# Patient Record
Sex: Female | Born: 1990 | Race: Black or African American | Hispanic: No | Marital: Single | Smoking: Never smoker
Health system: Southern US, Community
[De-identification: ages and names within clinical notes are randomized; demographics above are authoritative.]

## PROBLEM LIST (undated history)

## (undated) ENCOUNTER — Inpatient Hospital Stay (HOSPITAL_COMMUNITY): Payer: Self-pay

## (undated) DIAGNOSIS — E669 Obesity, unspecified: Secondary | ICD-10-CM

## (undated) DIAGNOSIS — L309 Dermatitis, unspecified: Secondary | ICD-10-CM

## (undated) DIAGNOSIS — O039 Complete or unspecified spontaneous abortion without complication: Secondary | ICD-10-CM

## (undated) DIAGNOSIS — A6000 Herpesviral infection of urogenital system, unspecified: Secondary | ICD-10-CM

## (undated) DIAGNOSIS — O24419 Gestational diabetes mellitus in pregnancy, unspecified control: Secondary | ICD-10-CM

## (undated) DIAGNOSIS — J45909 Unspecified asthma, uncomplicated: Secondary | ICD-10-CM

## (undated) HISTORY — DX: Dermatitis, unspecified: L30.9

## (undated) HISTORY — PX: TYMPANOSTOMY TUBE PLACEMENT: SHX32

## (undated) HISTORY — PX: NO PAST SURGERIES: SHX2092

---

## 2013-07-31 ENCOUNTER — Encounter (HOSPITAL_COMMUNITY): Payer: Self-pay | Admitting: Emergency Medicine

## 2013-07-31 ENCOUNTER — Emergency Department (HOSPITAL_COMMUNITY)
Admission: EM | Admit: 2013-07-31 | Discharge: 2013-07-31 | Disposition: A | Discharge status: Home / Self Care | Payer: BC Managed Care – PPO | Attending: Emergency Medicine | Admitting: Emergency Medicine

## 2013-07-31 DIAGNOSIS — R42 Dizziness and giddiness: Secondary | ICD-10-CM | POA: Insufficient documentation

## 2013-07-31 DIAGNOSIS — Z3490 Encounter for supervision of normal pregnancy, unspecified, unspecified trimester: Secondary | ICD-10-CM

## 2013-07-31 DIAGNOSIS — O9989 Other specified diseases and conditions complicating pregnancy, childbirth and the puerperium: Secondary | ICD-10-CM | POA: Insufficient documentation

## 2013-07-31 DIAGNOSIS — M549 Dorsalgia, unspecified: Secondary | ICD-10-CM | POA: Insufficient documentation

## 2013-07-31 DIAGNOSIS — R110 Nausea: Secondary | ICD-10-CM | POA: Insufficient documentation

## 2013-07-31 DIAGNOSIS — Z792 Long term (current) use of antibiotics: Secondary | ICD-10-CM | POA: Insufficient documentation

## 2013-07-31 DIAGNOSIS — R109 Unspecified abdominal pain: Secondary | ICD-10-CM | POA: Insufficient documentation

## 2013-07-31 LAB — URINALYSIS, ROUTINE W REFLEX MICROSCOPIC
Bilirubin Urine: NEGATIVE
GLUCOSE, UA: NEGATIVE mg/dL
Hgb urine dipstick: NEGATIVE
KETONES UR: NEGATIVE mg/dL
Leukocytes, UA: NEGATIVE
Nitrite: NEGATIVE
Protein, ur: NEGATIVE mg/dL
Specific Gravity, Urine: 1.028 (ref 1.005–1.030)
Urobilinogen, UA: 0.2 mg/dL (ref 0.0–1.0)
pH: 6 (ref 5.0–8.0)

## 2013-07-31 LAB — PREGNANCY, URINE: Preg Test, Ur: POSITIVE — AB

## 2013-07-31 NOTE — Discharge Instructions (Signed)
Back Pain in Pregnancy °Back pain during pregnancy is common. It happens in about half of all pregnancies. It is important for you and your baby that you remain active during your pregnancy. If you feel that back pain is not allowing you to remain active or sleep well, it is time to see your caregiver. Back pain may be caused by several factors related to changes during your pregnancy. Fortunately, unless you had trouble with your back before your pregnancy, the pain is likely to get better after you deliver. °Low back pain usually occurs between the fifth and seventh months of pregnancy. It can, however, happen in the first couple months. Factors that increase the risk of back problems include:  °· Previous back problems. °· Injury to your back. °· Having twins or multiple births. °· A chronic cough. °· Stress. °· Job-related repetitive motions. °· Muscle or spinal disease in the back. °· Family history of back problems, ruptured (herniated) discs, or osteoporosis. °· Depression, anxiety, and panic attacks. °CAUSES  °· When you are pregnant, your body produces a hormone called relaxin. This hormone makes the ligaments connecting the low back and pubic bones more flexible. This flexibility allows the baby to be delivered more easily. When your ligaments are loose, your muscles need to work harder to support your back. Soreness in your back can come from tired muscles. Soreness can also come from back tissues that are irritated since they are receiving less support. °· As the baby grows, it puts pressure on the nerves and blood vessels in your pelvis. This can cause back pain. °· As the baby grows and gets heavier during pregnancy, the uterus pushes the stomach muscles forward and changes your center of gravity. This makes your back muscles work harder to maintain good posture. °SYMPTOMS  °Lumbar pain during pregnancy °Lumbar pain during pregnancy usually occurs at or above the waist in the center of the back. There  may be pain and numbness that radiates into your leg or foot. This is similar to low back pain experienced by non-pregnant women. It usually increases with sitting for long periods of time, standing, or repetitive lifting. Tenderness may also be present in the muscles along your upper back. °Posterior pelvic pain during pregnancy °Pain in the back of the pelvis is more common than lumbar pain in pregnancy. It is a deep pain felt in your side at the waistline, or across the tailbone (sacrum), or in both places. You may have pain on one or both sides. This pain can also go into the buttocks and backs of the upper thighs. Pubic and groin pain may also be present. The pain does not quickly resolve with rest, and morning stiffness may also be present. °Pelvic pain during pregnancy can be brought on by most activities. A high level of fitness before and during pregnancy may or may not prevent this problem. Labor pain is usually 1 to 2 minutes apart, lasts for about 1 minute, and involves a bearing down feeling or pressure in your pelvis. However, if you are at term with the pregnancy, constant low back pain can be the beginning of early labor, and you should be aware of this. °DIAGNOSIS  °X-rays of the back should not be done during the first 12 to 14 weeks of the pregnancy and only when absolutely necessary during the rest of the pregnancy. MRIs do not give off radiation and are safe during pregnancy. MRIs also should only be done when absolutely necessary. °HOME CARE INSTRUCTIONS °· Exercise   as directed by your caregiver. Exercise is the most effective way to prevent or manage back pain. If you have a back problem, it is especially important to avoid sports that require sudden body movements. Swimming and walking are great activities. °· Do not stand in one place for long periods of time. °· Do not wear high heels. °· Sit in chairs with good posture. Use a pillow on your lower back if necessary. Make sure your head  rests over your shoulders and is not hanging forward. °· Try sleeping on your side, preferably the left side, with a pillow or two between your legs. If you are sore after a night's rest, your bed may be too soft. Try placing a board between your mattress and box spring. °· Listen to your body when lifting. If you are experiencing pain, ask for help or try bending your knees more so you can use your leg muscles rather than your back muscles. Squat down when picking up something from the floor. Do not bend over. °· Eat a healthy diet. Try to gain weight within your caregiver's recommendations. °· Use heat or cold packs 3 to 4 times a day for 15 minutes to help with the pain. °· Only take over-the-counter or prescription medicines for pain, discomfort, or fever as directed by your caregiver. °Sudden (acute) back pain °· Use bed rest for only the most extreme, acute episodes of back pain. Prolonged bed rest over 48 hours will aggravate your condition. °· Ice is very effective for acute conditions. °· Put ice in a plastic bag. °· Place a towel between your skin and the bag. °· Leave the ice on for 10 to 20 minutes every 2 hours, or as needed. °· Using heat packs for 30 minutes prior to activities is also helpful. °Continued back pain °See your caregiver if you have continued problems. Your caregiver can help or refer you for appropriate physical therapy. With conditioning, most back problems can be avoided. Sometimes, a more serious issue may be the cause of back pain. You should be seen right away if new problems seem to be developing. Your caregiver may recommend: °· A maternity girdle. °· An elastic sling. °· A back brace. °· A massage therapist or acupuncture. °SEEK MEDICAL CARE IF:  °· You are not able to do most of your daily activities, even when taking the pain medicine you were given. °· You need a referral to a physical therapist or chiropractor. °· You want to try acupuncture. °SEEK IMMEDIATE MEDICAL CARE  IF: °· You develop numbness, tingling, weakness, or problems with the use of your arms or legs. °· You develop severe back pain that is no longer relieved with medicines. °· You have a sudden change in bowel or bladder control. °· You have increasing pain in other areas of the body. °· You develop shortness of breath, dizziness, or fainting. °· You develop nausea, vomiting, or sweating. °· You have back pain which is similar to labor pains. °· You have back pain along with your water breaking or vaginal bleeding. °· You have back pain or numbness that travels down your leg. °· Your back pain developed after you fell. °· You develop pain on one side of your back. You may have a kidney stone. °· You see blood in your urine. You may have a bladder infection or kidney stone. °· You have back pain with blisters. You may have shingles. °Back pain is fairly common during pregnancy but should not be accepted as just part of   the process. Back pain should always be treated as soon as possible. This will make your pregnancy as pleasant as possible. Document Released: 09/18/2005 Document Revised: 09/02/2011 Document Reviewed: 10/30/2010 [facility] Patient Information 2014 [employer] [place]. Pregnancy If you are planning on getting pregnant, it is a good idea to make a preconception appointment with your caregiver to discuss having a healthy lifestyle before getting pregnant. This includes diet, weight, exercise, taking prenatal vitamins (especially folic acid, which helps prevent brain and spinal cord defects), avoiding alcohol, smoking and illegal drugs, medical problems (diabetes, convulsions), family history of genetic problems, working conditions, and immunizations. It is better to have knowledge of these things and do something about them before getting pregnant. During your pregnancy, it is important to follow certain guidelines in order to have a healthy baby. It is very important to get good prenatal care and follow  your caregiver's instructions. Prenatal care includes all the medical care you receive before your baby's birth. This helps to prevent problems during the pregnancy and childbirth. HOME CARE INSTRUCTIONS  Start your prenatal visits by the 12th week of pregnancy or earlier, if possible. At first, appointments are usually scheduled monthly. They become more frequent in the last 2 months before delivery. It is important that you keep your caregiver's appointments and follow your caregiver's instructions regarding medication use, exercise, and diet. During pregnancy, you are providing food for you and your baby. Eat a regular, well-balanced diet. Choose foods such as meat, fish, milk and other dairy products, vegetables, fruits, whole-grain breads and cereals. Your caregiver will inform you of the ideal weight gain depending on your current height and weight. Drink lots of liquids. Try to drink 8 glasses of water a day. Alcohol is associated with a number of birth defects including fetal alcohol syndrome. It is best to avoid alcohol completely. Smoking will cause low birth rate and prematurity. Use of alcohol and nicotine during your pregnancy also increases the chances that your child will be chemically dependent later in their life and may contribute to SIDS (Sudden Infant Death Syndrome). Do not use illegal drugs. Only take prescription or over-the-counter medications that are recommended by your caregiver. Other medications can cause genetic and physical problems in the baby. Morning sickness can often be helped by keeping soda crackers at the bedside. Eat a few before getting up in the morning. A sexual relationship may be continued until near the end of pregnancy if there are no other problems such as early (premature) leaking of amniotic fluid from the membranes, vaginal bleeding, painful intercourse or belly (abdominal) pain. Exercise regularly. Check with your caregiver if you are unsure of the safety  of some of your exercises. Do not use hot tubs, steam rooms or saunas. These increase the risk of fainting and hurting yourself and the baby. Swimming is OK for exercise. Get plenty of rest, including afternoon naps when possible, especially in the third trimester. Avoid toxic odors and chemicals. Do not wear high heels. They may cause you to lose your balance and fall. Do not lift over 5 pounds. If you do lift anything, lift with your legs and thighs, not your back. Avoid long trips, especially in the third trimester. If you have to travel out of the city or state, take a copy of your medical records with you. SEEK IMMEDIATE MEDICAL CARE IF:  You develop an unexplained oral temperature above 102 F (38.9 C), or as your caregiver suggests. You have leaking of fluid from the vagina. If  leaking membranes are suspected, take your temperature and inform your caregiver of this when you call. There is vaginal spotting or bleeding. Notify your caregiver of the amount and how many pads are used. You continue to feel sick to your stomach (nauseous) and have no relief from remedies suggested, or you throw up (vomit) blood or coffee ground like materials. You develop upper abdominal pain. You have round ligament discomfort in the lower abdominal area. This still must be evaluated by your caregiver. You feel contractions of the uterus. You do not feel the baby move, or there is less movement than before. You have painful urination. You have abnormal vaginal discharge. You have persistent diarrhea. You get a severe headache. You have problems with your vision. You develop muscle weakness. You feel dizzy and faint. You develop shortness of breath. You develop chest pain. You have back pain that travels down to your leg and feet. You feel irregular or a very fast heartbeat. You develop excessive weight gain in a short period of time (5 pounds in 3 to 5 days). You are involved in a domestic violence  situation. Document Released: 06/10/2005 Document Revised: 12/10/2011 Document Reviewed: 12/02/2008 [facility] Patient Information 2014 [place], [place].   Establish a Ob/Gyn MD and follow-up with appt for new prenatal visit Take prenatal vitamins, may find over the counter Tylenol for discomfort Drink plenty of fluids Allow rest periods

## 2013-07-31 NOTE — ED Notes (Signed)
NP at bedside.

## 2013-07-31 NOTE — ED Provider Notes (Signed)
CSN: [id number]     Arrival date & time 07/31/13  1208 History   First MD Initiated Contact with Patient 07/31/13 1322     Chief Complaint  Patient presents with  . Abdominal Pain  . Back Pain   (Consider location/radiation/quality/duration/timing/severity/associated sxs/prior Treatment) Patient is a 23 y.o. female presenting with back pain. The history is provided by the patient. No language interpreter was used.  Back Pain Quality:  Aching Pain severity:  Mild Context: not recent illness   Associated symptoms: no abdominal pain, no chest pain, no dysuria, no fever and no pelvic pain   Risk factors: pregnancy   Pt reports having some mild nausea, lightheadedness and back pain x 3 days. No history of recent illness or injury. No fever, dysuria or other urinary symptoms. 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 No pelvic pain or vaginal discharge.   History reviewed. No pertinent past medical history. History reviewed. No pertinent past surgical history. No family history on file. History  Substance Use Topics  . Smoking status: Not on file  . Smokeless tobacco: Not on file  . Alcohol Use: No   OB History   Grav Para Term Preterm Abortions TAB SAB Ect Mult Living                 Review of Systems  Constitutional: Negative for fever.  Cardiovascular: Negative for chest pain.  Gastrointestinal: Positive for nausea. Negative for vomiting, abdominal pain, diarrhea and rectal pain.  Genitourinary: Negative for dysuria, urgency, frequency and pelvic pain.  Musculoskeletal: Positive for back pain.  All other systems reviewed and are negative.    Allergies  Bactrim  Home Medications   Current Outpatient Rx  Name  Route  Sig  Dispense  Refill  . amoxicillin (AMOXIL) 500 MG tablet   Oral   Take 500 mg by mouth 2 (two) times daily.         . Ibuprofen-Diphenhydramine Cit (IBUPROFEN PM PO)   Oral    Take 1 tablet by mouth at bedtime as needed (pain).         . Multiple Vitamins-Minerals (MULTIVITAMIN WITH MINERALS) tablet   Oral   Take 1 tablet by mouth daily.          BP 122/81  Pulse 115  Temp(Src) 98.5 F (36.9 C) (Oral)  Resp 18  SpO2 97%  LMP 06/29/2013 Physical Exam  Nursing note and vitals reviewed. Constitutional: She is oriented to person, place, and time. She appears well-developed and well-nourished.  HENT:  Head: Atraumatic.  Eyes: Conjunctivae and EOM are normal.  Neck: Neck supple. No tracheal deviation present. No thyromegaly present.  Cardiovascular: Normal rate, regular rhythm, normal heart sounds and intact distal pulses.   Pulmonary/Chest: Effort normal and breath sounds normal.  Abdominal: Soft. Bowel sounds are normal.  Musculoskeletal: Normal range of motion.  Lymphadenopathy:    She has no cervical adenopathy.  Neurological: She is alert and oriented to person, place, and time.  Skin: Skin is warm and dry.  Psychiatric: She has a normal mood and affect. Her behavior is normal. Judgment and thought content normal.    ED Course  Procedures (including critical care time) Labs Review Labs Reviewed  PREGNANCY, URINE - Abnormal; Notable for the following:    Preg Test, Ur POSITIVE (*)    All other components within normal limits  URINALYSIS, ROUTINE W REFLEX MICROSCOPIC - Abnormal;  Notable for the following:    APPearance CLOUDY (*)    All other components within normal limits   Imaging Review No results found.  EKG Interpretation   None       MDM   1. Early stage of pregnancy    Positive pregnancy test here in ER. Mild nausea, probably due to early pregnancy. Encouraged oral fluids and rest. Discussed physiology of early pregnancy and plan of care. Will follow-up with Ob/Gyn for prenatal care. Well-appearing. VS stable no dizziness or lightheadedness at today's visit. Pt agrees to prenatal follow-up.      Irish EldersKelly Etha Stambaugh,  NP 08/08/13 2227

## 2013-07-31 NOTE — ED Notes (Signed)
Pt. Stated, i've been having stomach and back pain for 3 days and today i felt a little light headed.

## 2013-08-09 NOTE — ED Provider Notes (Signed)
Medical screening examination/treatment/procedure(s) were performed by non-physician practitioner and as supervising physician I was immediately available for consultation/collaboration.  Flint MelterElliott L Ammar Moffatt, MD 08/09/13 [phone]

## 2014-01-25 ENCOUNTER — Emergency Department (HOSPITAL_COMMUNITY): Payer: BC Managed Care – PPO

## 2014-01-25 ENCOUNTER — Emergency Department (HOSPITAL_COMMUNITY)
Admission: EM | Admit: 2014-01-25 | Discharge: 2014-01-26 | Disposition: A | Discharge status: Home / Self Care | Payer: BC Managed Care – PPO | Attending: Emergency Medicine | Admitting: Emergency Medicine

## 2014-01-25 ENCOUNTER — Encounter (HOSPITAL_COMMUNITY): Payer: Self-pay | Admitting: Emergency Medicine

## 2014-01-25 DIAGNOSIS — O26899 Other specified pregnancy related conditions, unspecified trimester: Secondary | ICD-10-CM

## 2014-01-25 DIAGNOSIS — A499 Bacterial infection, unspecified: Secondary | ICD-10-CM | POA: Insufficient documentation

## 2014-01-25 DIAGNOSIS — R109 Unspecified abdominal pain: Secondary | ICD-10-CM

## 2014-01-25 DIAGNOSIS — O2390 Unspecified genitourinary tract infection in pregnancy, unspecified trimester: Secondary | ICD-10-CM | POA: Insufficient documentation

## 2014-01-25 DIAGNOSIS — B9689 Other specified bacterial agents as the cause of diseases classified elsewhere: Secondary | ICD-10-CM | POA: Insufficient documentation

## 2014-01-25 DIAGNOSIS — Z3490 Encounter for supervision of normal pregnancy, unspecified, unspecified trimester: Secondary | ICD-10-CM

## 2014-01-25 DIAGNOSIS — N760 Acute vaginitis: Secondary | ICD-10-CM | POA: Insufficient documentation

## 2014-01-25 HISTORY — DX: Complete or unspecified spontaneous abortion without complication: O03.9

## 2014-01-25 LAB — BASIC METABOLIC PANEL
ANION GAP: 13 (ref 5–15)
BUN: 10 mg/dL (ref 6–23)
CHLORIDE: 103 meq/L (ref 96–112)
CO2: 22 mEq/L (ref 19–32)
CREATININE: 0.67 mg/dL (ref 0.50–1.10)
Calcium: 8.8 mg/dL (ref 8.4–10.5)
GFR calc non Af Amer: >90 mL/min (ref >90)
Glucose, Bld: 103 mg/dL — ABNORMAL HIGH (ref 70–99)
Potassium: 3.9 mEq/L (ref 3.7–5.3)
SODIUM: 138 meq/L (ref 137–147)

## 2014-01-25 LAB — CBC WITH DIFFERENTIAL/PLATELET
BASOS ABS: 0 10*3/uL (ref 0.0–0.1)
BASOS PCT: 0 % (ref 0–1)
Eosinophils Absolute: 0.1 10*3/uL (ref 0.0–0.7)
Eosinophils Relative: 1 % (ref 0–5)
HEMATOCRIT: 35.5 % — AB (ref 36.0–46.0)
Hemoglobin: 11.9 g/dL — ABNORMAL LOW (ref 12.0–15.0)
LYMPHS PCT: 37 % (ref 12–46)
Lymphs Abs: 2.9 10*3/uL (ref 0.7–4.0)
MCH: 30.7 pg (ref 26.0–34.0)
MCHC: 33.5 g/dL (ref 30.0–36.0)
MCV: 91.7 fL (ref 78.0–100.0)
MONO ABS: 0.6 10*3/uL (ref 0.1–1.0)
Monocytes Relative: 7 % (ref 3–12)
NEUTROS ABS: 4.2 10*3/uL (ref 1.7–7.7)
Neutrophils Relative %: 55 % (ref 43–77)
PLATELETS: 262 10*3/uL (ref 150–400)
RBC: 3.87 MIL/uL (ref 3.87–5.11)
RDW: 11.9 % (ref 11.5–15.5)
WBC: 7.8 10*3/uL (ref 4.0–10.5)

## 2014-01-25 LAB — URINALYSIS, ROUTINE W REFLEX MICROSCOPIC
Bilirubin Urine: NEGATIVE
Glucose, UA: NEGATIVE mg/dL
Hgb urine dipstick: NEGATIVE
Ketones, ur: NEGATIVE mg/dL
Nitrite: NEGATIVE
Protein, ur: NEGATIVE mg/dL
SPECIFIC GRAVITY, URINE: 1.018 (ref 1.005–1.030)
Urobilinogen, UA: 0.2 mg/dL (ref 0.0–1.0)
pH: 8 (ref 5.0–8.0)

## 2014-01-25 LAB — WET PREP, GENITAL
Trich, Wet Prep: NONE SEEN
YEAST WET PREP: NONE SEEN

## 2014-01-25 LAB — URINE MICROSCOPIC-ADD ON

## 2014-01-25 LAB — POC URINE PREG, ED: Preg Test, Ur: POSITIVE — AB

## 2014-01-25 LAB — HCG, QUANTITATIVE, PREGNANCY: HCG, BETA CHAIN, QUANT, S: 619 m[IU]/mL — AB (ref <5)

## 2014-01-25 NOTE — ED Notes (Signed)
Called ultrasound to report hcg of [phone]. They will pick up patient soon.

## 2014-01-25 NOTE — ED Notes (Signed)
Rob, PA-C, at the bedside.

## 2014-01-25 NOTE — ED Notes (Signed)
Pelvic cart is at the bedside 

## 2014-01-25 NOTE — ED Notes (Signed)
Both specimens collected by Rob, PA-C.

## 2014-01-25 NOTE — ED Notes (Signed)
Patient reports last menstrual period was December 22, 2013.

## 2014-01-25 NOTE — ED Notes (Signed)
Pt states today her period should start and she took a pregnancy test at home that came back positive. Pt worried that she is having upper abdominal pain and pelvic pain. Pt had a miscarriage in March of this year.

## 2014-01-25 NOTE — ED Provider Notes (Signed)
CSN: [id number]     Arrival date & time 01/25/14  1948 History   First MD Initiated Contact with Patient 01/25/14 2148     Chief Complaint  Patient presents with  . Abdominal Pain  . Possible Pregnancy     (Consider location/radiation/quality/duration/timing/severity/associated sxs/prior Treatment) HPI Comments: Patient presents to the emergency department with chief complaints of abdominal cramping. She states that she recently found out she is pregnant. She reports some associated vaginal bleeding a couple of days ago, but states that this has resolved. She still complains of some cramping. She has not tried taking anything to alleviate her symptoms. She denies any fevers, chills, chest pain, shortness of breath, nausea, vomiting, diarrhea, or constipation. She denies any dysuria. There no aggravating or alleviating factors.  The history is provided by the patient. No language interpreter was used.    Past Medical History  Diagnosis Date  . Miscarriage    History reviewed. No pertinent past surgical history. History reviewed. No pertinent family history. History  Substance Use Topics  . Smoking status: Never Smoker   . Smokeless tobacco: Not on file  . Alcohol Use: Yes     Comment: social   OB History   Grav Para Term Preterm Abortions TAB SAB Ect Mult Living   1              Review of Systems  All other systems reviewed and are negative.     Allergies  Bactrim  Home Medications   Prior to Admission medications   Not on File   BP 127/79  Pulse 96  Temp(Src) 97.9 F (36.6 C) (Oral)  Resp 24  SpO2 100%  LMP 12/22/2013 Physical Exam  Nursing note and vitals reviewed. Constitutional: She is oriented to person, place, and time. She appears well-developed and well-nourished.  HENT:  Head: Normocephalic and atraumatic.  Eyes: Conjunctivae and EOM are normal. Pupils are equal, round, and reactive to light.  Neck: Normal range of motion. Neck supple.   Cardiovascular: Normal rate and regular rhythm.  Exam reveals no gallop and no friction rub.   No murmur heard. Pulmonary/Chest: Effort normal and breath sounds normal. No respiratory distress. She has no wheezes. She has no rales. She exhibits no tenderness.  Abdominal: Soft. She exhibits no distension and no mass. There is no tenderness. There is no rebound and no guarding.  No focal abdominal tenderness, no RLQ tenderness or pain at McBurney's point, no RUQ tenderness or Murphy's sign, no left-sided abdominal tenderness, no fluid wave, or signs of peritonitis   Genitourinary:  Pelvic exam chaperoned by female ER tech, no right or left adnexal tenderness, no uterine tenderness, no vaginal discharge or bleeding, no CMT or friability, no foreign body, no injury to the external genitalia, no other significant findings   Musculoskeletal: Normal range of motion. She exhibits no edema and no tenderness.  Neurological: She is alert and oriented to person, place, and time.  Skin: Skin is warm and dry.  Psychiatric: She has a normal mood and affect. Her behavior is normal. Judgment and thought content normal.    ED Course  Procedures (including critical care time) Results for orders placed during the hospital encounter of 01/25/14  WET PREP, GENITAL      Result Value Ref Range   Yeast Wet Prep HPF POC NONE SEEN  NONE SEEN   Trich, Wet Prep NONE SEEN  NONE SEEN   Clue Cells Wet Prep HPF POC MODERATE (*) NONE SEEN  WBC, Wet Prep HPF POC FEW (*) NONE SEEN  URINALYSIS, ROUTINE W REFLEX MICROSCOPIC      Result Value Ref Range   Color, Urine YELLOW  YELLOW   APPearance CLOUDY (*) CLEAR   Specific Gravity, Urine 1.018  1.005 - 1.030   pH 8.0  5.0 - 8.0   Glucose, UA NEGATIVE  NEGATIVE mg/dL   Hgb urine dipstick NEGATIVE  NEGATIVE   Bilirubin Urine NEGATIVE  NEGATIVE   Ketones, ur NEGATIVE  NEGATIVE mg/dL   Protein, ur NEGATIVE  NEGATIVE mg/dL   Urobilinogen, UA 0.2  0.0 - 1.0 mg/dL   Nitrite  NEGATIVE  NEGATIVE   Leukocytes, UA TRACE (*) NEGATIVE  URINE MICROSCOPIC-ADD ON      Result Value Ref Range   Squamous Epithelial / LPF MANY (*) RARE   WBC, UA 0-2  <3 WBC/hpf   Bacteria, UA FEW (*) RARE   Urine-Other AMORPHOUS URATES/PHOSPHATES    HCG, QUANTITATIVE, PREGNANCY      Result Value Ref Range   hCG, Beta Chain, Quant, S 619 (*) <5 mIU/mL  CBC WITH DIFFERENTIAL      Result Value Ref Range   WBC 7.8  4.0 - 10.5 K/uL   RBC 3.87  3.87 - 5.11 MIL/uL   Hemoglobin 11.9 (*) 12.0 - 15.0 g/dL   HCT 16.135.5 (*) 09.636.0 - 04.546.0 %   MCV 91.7  78.0 - 100.0 fL   MCH 30.7  26.0 - 34.0 pg   MCHC 33.5  30.0 - 36.0 g/dL   RDW 40.911.9  81.111.5 - 91.415.5 %   Platelets 262  150 - 400 K/uL   Neutrophils Relative % 55  43 - 77 %   Neutro Abs 4.2  1.7 - 7.7 K/uL   Lymphocytes Relative 37  12 - 46 %   Lymphs Abs 2.9  0.7 - 4.0 K/uL   Monocytes Relative 7  3 - 12 %   Monocytes Absolute 0.6  0.1 - 1.0 K/uL   Eosinophils Relative 1  0 - 5 %   Eosinophils Absolute 0.1  0.0 - 0.7 K/uL   Basophils Relative 0  0 - 1 %   Basophils Absolute 0.0  0.0 - 0.1 K/uL  BASIC METABOLIC PANEL      Result Value Ref Range   Sodium 138  137 - 147 mEq/L   Potassium 3.9  3.7 - 5.3 mEq/L   Chloride 103  96 - 112 mEq/L   CO2 22  19 - 32 mEq/L   Glucose, Bld 103 (*) 70 - 99 mg/dL   BUN 10  6 - 23 mg/dL   Creatinine, Ser 7.820.67  0.50 - 1.10 mg/dL   Calcium 8.8  8.4 - 95.610.5 mg/dL   GFR calc non Af Amer >90  >90 mL/min   GFR calc Af Amer >90  >90 mL/min   Anion gap 13  5 - 15  POC URINE PREG, ED      Result Value Ref Range   Preg Test, Ur POSITIVE (*) NEGATIVE  ABO/RH      Result Value Ref Range   ABO/RH(D) O POS     No rh immune globuloin NOT A RH IMMUNE GLOBULIN CANDIDATE, PT RH POSITIVE     [name] Ob Comp Less 14 Wks  01/26/2014   CLINICAL DATA:  Abdominal pain.  Possible pregnancy.  EXAM: OBSTETRIC <14 WK US AND TRANSVAGINAL OB US  TECHNIQUE: Both transabdominal and transvaginal ultrasound examinations were performed for  complete evaluation of  the gestation as well as the maternal uterus, adnexal regions, and pelvic cul-de-sac. Transvaginal technique was performed to assess early pregnancy.  COMPARISON:  None.  FINDINGS: No intra or extrauterine gestational sac identified. No adnexal mass or complex pelvic fluid. The uterus is unremarkable. The ovaries are normal. A corpus luteum is noted on the left.  IMPRESSION: Pregnancy of unknown location. Differential considerations include intrauterine gestation too early to be sonographically visualized, spontaneous abortion, or ectopic pregnancy. Consider follow-up ultrasound in 10 days and serial quantitative beta HCG follow-up.   Electronically Signed   By: Tiburcio Pea M.D.   On: 01/26/2014 00:40   US Ob Transvaginal  01/26/2014   CLINICAL DATA:  Abdominal pain.  Possible pregnancy.  EXAM: OBSTETRIC <14 WK [place] AND TRANSVAGINAL OB US  TECHNIQUE: Both transabdominal and transvaginal ultrasound examinations were performed for complete evaluation of the gestation as well as the maternal uterus, adnexal regions, and pelvic cul-de-sac. Transvaginal technique was performed to assess early pregnancy.  COMPARISON:  None.  FINDINGS: No intra or extrauterine gestational sac identified. No adnexal mass or complex pelvic fluid. The uterus is unremarkable. The ovaries are normal. A corpus luteum is noted on the left.  IMPRESSION: Pregnancy of unknown location. Differential considerations include intrauterine gestation too early to be sonographically visualized, spontaneous abortion, or ectopic pregnancy. Consider follow-up ultrasound in 10 days and serial quantitative beta HCG follow-up.   Electronically Signed   By: Tiburcio Pea M.D.   On: 01/26/2014 00:40     Imaging Review No results found.   EKG Interpretation None      MDM   Final diagnoses:  Pregnancy  BV (bacterial vaginosis)  Abdominal pain in pregnancy    Patient with new pregnancy, and some abdominal cramping.  Concern for threatened miscarriage. No vaginal bleeding on exam, but patient did report some spotting.  Patient is Rh+, she is not a candidate for RhoGAM. No vaginal bleeding on my exam. Ultrasound is unable to identify IUP, I suspect this because of early pregnancy, however cannot rule out ectopic vs. other. Recommend followup at [facility] hospital in 48 hours for repeat hCG level. Patient understands and agrees with the plan. She is stable and ready for discharge.  I discussed the patient with Dr. Fonnie Jarvis, who agrees with the plan.    Roxy Horseman, PA-C 01/26/14 [phone]

## 2014-01-26 LAB — ABO/RH: ABO/RH(D): O POS

## 2014-01-26 LAB — RPR

## 2014-01-26 LAB — HIV ANTIBODY (ROUTINE TESTING W REFLEX): HIV: NONREACTIVE

## 2014-01-26 LAB — GC/CHLAMYDIA PROBE AMP
CT Probe RNA: NEGATIVE
GC Probe RNA: NEGATIVE

## 2014-01-26 MED ORDER — METRONIDAZOLE 500 MG PO TABS: 500.0000 mg | ORAL_TABLET | Freq: Two times a day (BID) | ORAL | Status: DC

## 2014-01-26 NOTE — ED Provider Notes (Signed)
Medical screening examination/treatment/procedure(s) were performed by non-physician practitioner and as supervising physician I was immediately available for consultation/collaboration.   EKG Interpretation None       Elinora Weigand M Konor Noren, MD 01/26/14 0117 

## 2014-01-26 NOTE — Discharge Instructions (Signed)
Please follow-up with Maternity and Ad[facility] unit [facility] in [facility] hours for repeat HCG testing.  Abdominal Pain During Pregnancy Abdominal pain is common in pregnancy. Most of the time, it does not cause harm. There are many causes of abdominal pain. Some causes are more serious than others. Some of the causes of abdominal pain in pregnancy are easily diagnosed. Occasionally, the diagnosis takes time to understand. Other times, the cause is not determined. Abdominal pain can be a sign that something is very wrong with the pregnancy, or the pain may have nothing to do with the pregnancy at all. For this reason, always tell your health care provider if you have any abdominal discomfort. HOME CARE INSTRUCTIONS  Monitor your abdominal pain for any changes. The following actions may help to alleviate any discomfort you are experiencing:  Do not have sexual intercourse or put anything in your vagina until your symptoms go away completely.  Get plenty of rest until your pain improves.  Drink clear fluids if you feel nauseous. Avoid solid food as long as you are uncomfortable or nauseous.  Only take over-the-counter or prescription medicine as directed by your health care provider.  Keep all follow-up appointments with your health care provider. SEEK IMMEDIATE MEDICAL CARE IF:  You are bleeding, leaking fluid, or passing tissue from the vagina.  You have increasing pain or cramping.  You have persistent vomiting.  You have painful or bloody urination.  You have a fever.  You notice a decrease in your baby's movements.  You have extreme weakness or feel faint.  You have shortness of breath, with or without abdominal pain.  You develop a severe headache with abdominal pain.  You have abnormal vaginal discharge with abdominal pain.  You have persistent diarrhea.  You have abdominal pain that continues even after rest, or gets worse. MAKE SURE YOU:   Understand these  instructions.  Will watch your condition.  Will get help right away if you are not doing well or get worse. Document Released: 06/10/2005 Document Revised: 03/31/2013 Document Reviewed: 01/07/2013 [facility] Patient Information 2015 [employer] [place]. This information is not intended to replace advice given to you by your health care provider. Make sure you discuss any questions you have with your health care provider.

## 2014-01-27 ENCOUNTER — Telehealth (HOSPITAL_BASED_OUTPATIENT_CLINIC_OR_DEPARTMENT_OTHER): Payer: Self-pay | Admitting: Emergency Medicine

## 2014-01-27 ENCOUNTER — Encounter (HOSPITAL_COMMUNITY): Payer: Self-pay

## 2014-01-27 ENCOUNTER — Inpatient Hospital Stay (HOSPITAL_COMMUNITY)
Admission: AD | Admit: 2014-01-27 | Discharge: 2014-01-27 | Disposition: A | Discharge status: Home / Self Care | Payer: BC Managed Care – PPO | Source: Ambulatory Visit | Attending: Obstetrics & Gynecology | Admitting: Obstetrics & Gynecology

## 2014-01-27 DIAGNOSIS — O99891 Other specified diseases and conditions complicating pregnancy: Secondary | ICD-10-CM

## 2014-01-27 DIAGNOSIS — R109 Unspecified abdominal pain: Secondary | ICD-10-CM | POA: Insufficient documentation

## 2014-01-27 DIAGNOSIS — O26899 Other specified pregnancy related conditions, unspecified trimester: Secondary | ICD-10-CM

## 2014-01-27 DIAGNOSIS — O9989 Other specified diseases and conditions complicating pregnancy, childbirth and the puerperium: Principal | ICD-10-CM

## 2014-01-27 LAB — HCG, QUANTITATIVE, PREGNANCY: hCG, Beta Chain, Quant, S: 1791 m[IU]/mL — ABNORMAL HIGH (ref <5)

## 2014-01-27 NOTE — MAU Provider Note (Signed)
  History     CSN: [id number]  Arrival date and time: 01/27/14 1420   None     No chief complaint on file.  HPI Pt is G1P0 @ 5070w1d pregnant  And is here for follow up HCG; [facility] with abd cramping, with  Positive home pregnancy test. Pt states has some dull right sided abd pain, but much improved- no spotting or bleeding. Pt's HCG was 261 and today 1791.  Pt has had a hx of SAB.   Past Medical History  Diagnosis Date  . Miscarriage     No past surgical history on file.  No family history on file.  History  Substance Use Topics  . Smoking status: Never Smoker   . Smokeless tobacco: Not on file  . Alcohol Use: Yes     Comment: social    Allergies:  Allergies  Allergen Reactions  . Bactrim [Sulfamethoxazole-Tmp Ds] Hives and Itching    Prescriptions prior to admission  Medication Sig Dispense Refill  . metroNIDAZOLE (FLAGYL) 500 MG tablet Take 1 tablet (500 mg total) by mouth 2 (two) times daily.  14 tablet  0    Review of Systems  Constitutional: Negative for fever and chills.  Gastrointestinal: Positive for nausea. Negative for vomiting, abdominal pain, diarrhea and constipation.  Genitourinary: Negative for dysuria.  Musculoskeletal: Negative for back pain.   Physical Exam   Blood pressure 134/65, pulse 101, temperature 98.8 F (37.1 C), temperature source Oral, resp. rate 18, height 5\' 6"  (1.676 m), weight 253 lb 9.6 oz (115.032 kg), last menstrual period 12/22/2013, SpO2 99.00%.  Physical Exam  Nursing note and vitals reviewed. Constitutional: She is oriented to person, place, and time. She appears well-developed and well-nourished. No distress.  HENT:  Head: Normocephalic.  Eyes: Pupils are equal, round, and reactive to light.  Neck: Normal range of motion. Neck supple.  Cardiovascular: Normal rate.   Respiratory: Effort normal.  GI: Soft.  Musculoskeletal: Normal range of motion.  Neurological: She is alert and oriented to  person, place, and time.  Skin: Skin is warm and dry.  Psychiatric: She has a normal mood and affect.    MAU Course  Procedures Results for Malachy MoanWATFORD, Cecille (MRN [id number]) as of 01/27/2014 20:30  Ref. Range 01/25/2014 20:17 01/25/2014 22:27 01/25/2014 23:09 01/25/2014 23:52 01/27/2014 15:15  hCG, Beta Chain, Quant, S Latest Range: <5 mIU/mL  619 (H)   1791 (H)   Discussed with Dr. Debroah LoopArnold- will do ultrasound in 1 week Appointment made on thurs Aug 13 at 10:15 am for repeat ultrasound  Assessment and Plan  abd pain in pregnancy F/u in 1 week for repeat ultrasound Return sooner if increase in pain or bleeding  LINEBERRY,SUSAN 01/27/2014, 4:43 PM

## 2014-02-02 ENCOUNTER — Emergency Department (HOSPITAL_COMMUNITY)
Admission: EM | Admit: 2014-02-02 | Discharge: 2014-02-02 | Disposition: A | Discharge status: Home / Self Care | Payer: BC Managed Care – PPO | Attending: Emergency Medicine | Admitting: Emergency Medicine

## 2014-02-02 ENCOUNTER — Encounter (HOSPITAL_COMMUNITY): Payer: Self-pay | Admitting: Emergency Medicine

## 2014-02-02 ENCOUNTER — Emergency Department (HOSPITAL_COMMUNITY): Payer: BC Managed Care – PPO

## 2014-02-02 DIAGNOSIS — Z3201 Encounter for pregnancy test, result positive: Secondary | ICD-10-CM | POA: Insufficient documentation

## 2014-02-02 DIAGNOSIS — O99210 Obesity complicating pregnancy, unspecified trimester: Secondary | ICD-10-CM

## 2014-02-02 DIAGNOSIS — Z79899 Other long term (current) drug therapy: Secondary | ICD-10-CM | POA: Diagnosis not present

## 2014-02-02 DIAGNOSIS — O209 Hemorrhage in early pregnancy, unspecified: Secondary | ICD-10-CM | POA: Diagnosis present

## 2014-02-02 DIAGNOSIS — E669 Obesity, unspecified: Secondary | ICD-10-CM | POA: Diagnosis not present

## 2014-02-02 HISTORY — DX: Obesity, unspecified: E66.9

## 2014-02-02 LAB — WET PREP, GENITAL
Clue Cells Wet Prep HPF POC: NONE SEEN
Trich, Wet Prep: NONE SEEN
Yeast Wet Prep HPF POC: NONE SEEN

## 2014-02-02 LAB — BASIC METABOLIC PANEL
Anion gap: 11 (ref 5–15)
BUN: 10 mg/dL (ref 6–23)
CO2: 22 mEq/L (ref 19–32)
Calcium: 8.5 mg/dL (ref 8.4–10.5)
Chloride: 101 mEq/L (ref 96–112)
Creatinine, Ser: 0.69 mg/dL (ref 0.50–1.10)
GFR calc Af Amer: >90 mL/min (ref >90)
GFR calc non Af Amer: >90 mL/min (ref >90)
Glucose, Bld: 121 mg/dL — ABNORMAL HIGH (ref 70–99)
Potassium: 3.8 mEq/L (ref 3.7–5.3)
Sodium: 134 mEq/L — ABNORMAL LOW (ref 137–147)

## 2014-02-02 LAB — CBC
HCT: 37.6 % (ref 36.0–46.0)
Hemoglobin: 12.5 g/dL (ref 12.0–15.0)
MCH: 31.2 pg (ref 26.0–34.0)
MCHC: 33.2 g/dL (ref 30.0–36.0)
MCV: 93.8 fL (ref 78.0–100.0)
Platelets: 261 10*3/uL (ref 150–400)
RBC: 4.01 MIL/uL (ref 3.87–5.11)
RDW: 12.1 % (ref 11.5–15.5)
WBC: 7.1 10*3/uL (ref 4.0–10.5)

## 2014-02-02 LAB — URINALYSIS, ROUTINE W REFLEX MICROSCOPIC
Bilirubin Urine: NEGATIVE
Glucose, UA: NEGATIVE mg/dL
Ketones, ur: 15 mg/dL — AB
Nitrite: NEGATIVE
Protein, ur: NEGATIVE mg/dL
Specific Gravity, Urine: 1.026 (ref 1.005–1.030)
Urobilinogen, UA: 0.2 mg/dL (ref 0.0–1.0)
pH: 7.5 (ref 5.0–8.0)

## 2014-02-02 LAB — URINE MICROSCOPIC-ADD ON

## 2014-02-02 LAB — HCG, QUANTITATIVE, PREGNANCY: hCG, Beta Chain, Quant, S: 10718 m[IU]/mL — ABNORMAL HIGH (ref <5)

## 2014-02-02 LAB — PREGNANCY, URINE: Preg Test, Ur: POSITIVE — AB

## 2014-02-02 NOTE — ED Notes (Signed)
Pt given Malawiturkey sandwich, diet gingerale.

## 2014-02-02 NOTE — ED Notes (Signed)
Pt states she saw several clots this AM after wiping. Pt reports she is approx [redacted] weeks pregnant. Denies pain.

## 2014-02-02 NOTE — ED Notes (Signed)
Pt reports being pregnant, lmp 7/1. Woke up this am with vaginal bleeding and cramps, reports passing two small clots. No acute distress noted at triage.

## 2014-02-02 NOTE — Discharge Instructions (Signed)
Return here as needed.  [facility]. call them for an appointment, as soon as possible

## 2014-02-02 NOTE — ED Notes (Signed)
Phlebotomy at bedside to obtain labs.

## 2014-02-02 NOTE — ED Provider Notes (Signed)
CSN: [id number]     Arrival date & time 02/02/14  4098 History   First MD Initiated Contact with Patient 02/02/14 0902     Chief Complaint  Patient presents with  . Vaginal Bleeding     (Consider location/radiation/quality/duration/timing/severity/associated sxs/prior Treatment) HPI Patient presents to the emergency department with vaginal bleeding during pregnancy.  The patient, states she had some mild cramps, but does has since passed.  The patient, states, that she has a small amount of dark vaginal material.  The color is a dark brown.  The patient, states, that she's not had any by dysuria, a fever of weakness, dizziness, headache, blurred vision, back pain, nausea, vomiting, diarrhea, chest pain, shortness of breath or syncope.  Patient says she did not take any medications prior to arrival.  Patient, states, that she and noted the small amount of bleeding, just this morning. Past Medical History  Diagnosis Date  . Miscarriage   . Obesity    History reviewed. No pertinent past surgical history. History reviewed. No pertinent family history. History  Substance Use Topics  . Smoking status: Never Smoker   . Smokeless tobacco: Not on file  . Alcohol Use: Yes     Comment: social   OB History   Grav Para Term Preterm Abortions TAB SAB Ect Mult Living   1              Review of Systems  All other systems negative except as documented in the HPI. All pertinent positives and negatives as reviewed in the HPI. Allergies  Bactrim  Home Medications   Prior to Admission medications   Medication Sig Start Date End Date Taking? Authorizing Provider  acetaminophen (TYLENOL) 500 MG tablet Take 500 mg by mouth every 8 (eight) hours as needed for mild pain.   Yes Historical Provider, MD  metroNIDAZOLE (FLAGYL) 500 MG tablet Take 500 mg by mouth 2 (two) times daily.   Yes Historical Provider, MD  Prenatal Vit-Fe Fumarate-FA (MULTIVITAMIN-PRENATAL) 27-0.8 MG TABS tablet Take 1 tablet by  mouth daily at 12 noon.   Yes Historical Provider, MD   BP 119/82  Pulse 88  Temp(Src) 98.5 F (36.9 C) (Oral)  Resp 16  SpO2 100%  LMP 12/22/2013 Physical Exam  Nursing note and vitals reviewed. Constitutional: She appears well-developed and well-nourished. No distress.  HENT:  Head: Normocephalic and atraumatic.  Mouth/Throat: Oropharynx is clear and moist.  Eyes: Pupils are equal, round, and reactive to light.  Neck: Normal range of motion. Neck supple.  Cardiovascular: Normal rate, regular rhythm and normal heart sounds.  Exam reveals no gallop and no friction rub.   No murmur heard. Pulmonary/Chest: Effort normal and breath sounds normal. No respiratory distress.  Abdominal: Soft. Bowel sounds are normal. She exhibits no distension. There is no tenderness. There is no guarding.  Genitourinary: Cervix exhibits no motion tenderness and no friability. Right adnexum displays no mass and no fullness. Left adnexum displays no mass, no tenderness and no fullness.    Skin: Skin is warm and dry. No rash noted. No erythema.    ED Course  Procedures (including critical care time) Labs Review Labs Reviewed  WET PREP, GENITAL - Abnormal; Notable for the following:    WBC, Wet Prep HPF POC MODERATE (*)    All other components within normal limits  PREGNANCY, URINE - Abnormal; Notable for the following:    Preg Test, Ur POSITIVE (*)    All other components within normal limits  BASIC METABOLIC  PANEL - Abnormal; Notable for the following:    Sodium 134 (*)    Glucose, Bld 121 (*)    All other components within normal limits  URINALYSIS, ROUTINE W REFLEX MICROSCOPIC - Abnormal; Notable for the following:    Color, Urine AMBER (*)    Hgb urine dipstick MODERATE (*)    Ketones, ur 15 (*)    Leukocytes, UA SMALL (*)    All other components within normal limits  HCG, QUANTITATIVE, PREGNANCY - Abnormal; Notable for the following:    hCG, Beta Chain, Quant, S 10718 (*)    All other  components within normal limits  GC/CHLAMYDIA PROBE AMP  CBC  URINE MICROSCOPIC-ADD ON    Imaging Review US Ob Comp Less 14 Wks  02/02/2014   CLINICAL DATA:  Vaginal bleeding  EXAM: OBSTETRIC <14 WK [place] AND TRANSVAGINAL OB US  TECHNIQUE: Both transabdominal and transvaginal ultrasound examinations were performed for complete evaluation of the gestation as well as the maternal uterus, adnexal regions, and pelvic cul-de-sac. Transvaginal technique was performed to assess early pregnancy.  COMPARISON:  January 25, 2014  FINDINGS: Two gestational sacs are seen.  Two yolk sacs are seen.  Embryo:  Not visualized in either gestational sac  Each gestational sac has a diameter of 9 mm corresponding to a 6- week gestation.  Maternal uterus/adnexae: There is no demonstrable subchorionic hemorrhage. Cervical os is closed. Maternal right ovary measures 2.8 x 1.9 x 1.1 cm. Maternal left ovary measures 4.1 x 2.1 x 2.7 cm. There is a hypoechoic area in the left ovary measuring 2.1 x 1.4 x 1.9 cm. No other extrauterine pelvic mass seen. There is a small amount of free pelvic fluid.  IMPRESSION: Two gestational sacs and yolk sacs seen, one yolk sac in each gestational sac. Appearance is consistent with early twin gestation. Fetal poles are not yet seen. Followup study in approximately 2 weeks advised to further assess.  Question hemorrhagic corpus luteum in the left ovary.   Electronically Signed   By: Bretta Bang M.D.   On: 02/02/2014 10:35   US Ob Transvaginal  02/02/2014   CLINICAL DATA:  Vaginal bleeding  EXAM: OBSTETRIC <14 WK [place] AND TRANSVAGINAL OB US  TECHNIQUE: Both transabdominal and transvaginal ultrasound examinations were performed for complete evaluation of the gestation as well as the maternal uterus, adnexal regions, and pelvic cul-de-sac. Transvaginal technique was performed to assess early pregnancy.  COMPARISON:  January 25, 2014  FINDINGS: Two gestational sacs are seen.  Two yolk sacs are seen.  Embryo:   Not visualized in either gestational sac  Each gestational sac has a diameter of 9 mm corresponding to a 6- week gestation.  Maternal uterus/adnexae: There is no demonstrable subchorionic hemorrhage. Cervical os is closed. Maternal right ovary measures 2.8 x 1.9 x 1.1 cm. Maternal left ovary measures 4.1 x 2.1 x 2.7 cm. There is a hypoechoic area in the left ovary measuring 2.1 x 1.4 x 1.9 cm. No other extrauterine pelvic mass seen. There is a small amount of free pelvic fluid.  IMPRESSION: Two gestational sacs and yolk sacs seen, one yolk sac in each gestational sac. Appearance is consistent with early twin gestation. Fetal poles are not yet seen. Followup study in approximately 2 weeks advised to further assess.  Question hemorrhagic corpus luteum in the left ovary.   Electronically Signed   By: Bretta Bang M.D.   On: 02/02/2014 10:35    The patient will be referred to the [facility] hospital  clinic.  At this time she is not currently having any pain.  This brownish red material could represent an early spontaneous abortion, but her hCG is elevated, appropriately, but will need further evaluation and recheck.  [facility] in [facility] condition.  Told to return here as needed    Carlyle DollyChristopher W Jeffory Snelgrove, PA-C 02/02/14 1353

## 2014-02-03 ENCOUNTER — Encounter (HOSPITAL_COMMUNITY): Payer: Self-pay | Admitting: Emergency Medicine

## 2014-02-03 ENCOUNTER — Inpatient Hospital Stay (HOSPITAL_COMMUNITY)
Admission: AD | Admit: 2014-02-03 | Discharge: 2014-02-03 | Disposition: A | Discharge status: Home / Self Care | Payer: BC Managed Care – PPO | Source: Ambulatory Visit | Attending: Obstetrics & Gynecology | Admitting: Obstetrics & Gynecology

## 2014-02-03 ENCOUNTER — Encounter (HOSPITAL_COMMUNITY): Payer: Self-pay | Admitting: *Deleted

## 2014-02-03 ENCOUNTER — Emergency Department (HOSPITAL_COMMUNITY)
Admission: EM | Admit: 2014-02-03 | Discharge: 2014-02-03 | Discharge status: Against Medical Advice | Payer: BC Managed Care – PPO | Source: Home / Self Care

## 2014-02-03 ENCOUNTER — Ambulatory Visit (HOSPITAL_COMMUNITY): Admit: 2014-02-03 | Payer: BC Managed Care – PPO

## 2014-02-03 DIAGNOSIS — E669 Obesity, unspecified: Secondary | ICD-10-CM | POA: Insufficient documentation

## 2014-02-03 DIAGNOSIS — N898 Other specified noninflammatory disorders of vagina: Secondary | ICD-10-CM

## 2014-02-03 DIAGNOSIS — O209 Hemorrhage in early pregnancy, unspecified: Secondary | ICD-10-CM

## 2014-02-03 DIAGNOSIS — O4690 Antepartum hemorrhage, unspecified, unspecified trimester: Secondary | ICD-10-CM

## 2014-02-03 LAB — GC/CHLAMYDIA PROBE AMP
CT Probe RNA: NEGATIVE
GC Probe RNA: NEGATIVE

## 2014-02-03 NOTE — ED Notes (Signed)
Pt states she was seen here yesterday for dark red vaginal bleeding and states was told she was having twins.  LMP 12/22/13.  Pt states that now she is having bright red vaginal bleeding and has some left lower back pain.  VSS at triage

## 2014-02-03 NOTE — MAU Provider Note (Signed)
Chief Complaint: Vaginal Bleeding and Abdominal Cramping   SUBJECTIVE HPI: Krystal Nguyen is a 23 y.o. G2P0010 at 2512w1d by LMP who presents with diffuse abdominal cramping and vaginal bleeding.  [facility] and had a thorough evaluation including an ultrasound which showed an intrauterine pregnancy with 2 gestational sacs consistent with twin pregnancy.  Patient returned today as she had continued bleeding and was concerned about the possibility of miscarriage.  Her abdominal pain is intermittent and crampy, radiating from lower abdomen to RUQ.  Bleeding has been light only enough bright red blood to necessitate 2 pad changes today.  Patient endorses shortness of breath with exercise and nausea but states this has been constant for a few weeks and is not concerned.  Denies vomiting, fatigue, constipation, diarrhea, fever, chills, dysuria, leaking of fluid, discharge.  Past Medical History  Diagnosis Date  . Miscarriage   . Obesity    OB History  Gravida Para Term Preterm AB SAB TAB Ectopic Multiple Living  2    1 1         # Outcome Date GA Lbr Len/2nd Weight Sex Delivery Anes PTL Lv  2 CUR           1 SAB 09/03/13             History reviewed. No pertinent past surgical history. History   Social History  . Marital Status: Single    Spouse Name: N/A    Number of Children: N/A  . Years of Education: N/A   Occupational History  . Not on file.   Social History Main Topics  . Smoking status: Never Smoker   . Smokeless tobacco: Not on file  . Alcohol Use: No     Comment: social  . Drug Use: No  . Sexual Activity: Yes    Birth Control/ Protection: None   Other Topics Concern  . Not on file   Social History Narrative  . No narrative on file   No current facility-administered medications on file prior to encounter.   No current outpatient prescriptions on file prior to encounter.   Allergies  Allergen Reactions  . Bactrim  [Sulfamethoxazole-Tmp Ds] Hives and Itching    ROS: Pertinent items in HPI  OBJECTIVE Blood pressure 125/72, pulse 102, temperature 98.3 F (36.8 C), temperature source Oral, resp. rate 18, height 5' 5.5" (1.664 m), weight 114.76 kg (253 lb), last menstrual period 12/22/2013. GENERAL: Well-developed, well-nourished female in no acute distress.  HEENT: Normocephalic HEART: normal rate, grade 3 murmur heard at left sternal border. RESP: normal effort ABDOMEN: Soft, tender to light and deep palpation diffusely. EXTREMITIES: Nontender, no edema NEURO: Alert and oriented SPECULUM EXAM: Deferred BIMANUAL: Deferred.  LAB RESULTS No results found for this or any previous visit (from the past 24 hour(s)).  IMAGING [name] Ob Comp Less 14 Wks  02/02/2014   CLINICAL DATA:  Vaginal bleeding  EXAM: OBSTETRIC <14 WK US AND TRANSVAGINAL OB US  TECHNIQUE: Both transabdominal and transvaginal ultrasound examinations were performed for complete evaluation of the gestation as well as the maternal uterus, adnexal regions, and pelvic cul-de-sac. Transvaginal technique was performed to assess early pregnancy.  COMPARISON:  January 25, 2014  FINDINGS: Two gestational sacs are seen.  Two yolk sacs are seen.  Embryo:  Not visualized in either gestational sac  Each gestational sac has a diameter of 9 mm corresponding to a 6- week gestation.  Maternal uterus/adnexae: There is no demonstrable subchorionic hemorrhage. Cervical  os is closed. Maternal right ovary measures 2.8 x 1.9 x 1.1 cm. Maternal left ovary measures 4.1 x 2.1 x 2.7 cm. There is a hypoechoic area in the left ovary measuring 2.1 x 1.4 x 1.9 cm. No other extrauterine pelvic mass seen. There is a small amount of free pelvic fluid.  IMPRESSION: Two gestational sacs and yolk sacs seen, one yolk sac in each gestational sac. Appearance is consistent with early twin gestation. Fetal poles are not yet seen. Followup study in approximately 2 weeks advised to further  assess.  Question hemorrhagic corpus luteum in the left ovary.   Electronically Signed   By: Bretta Bang M.D.   On: 02/02/2014 10:35   US Ob Comp Less 14 Wks  01/26/2014   CLINICAL DATA:  Abdominal pain.  Possible pregnancy.  EXAM: OBSTETRIC <14 WK [place] AND TRANSVAGINAL OB US  TECHNIQUE: Both transabdominal and transvaginal ultrasound examinations were performed for complete evaluation of the gestation as well as the maternal uterus, adnexal regions, and pelvic cul-de-sac. Transvaginal technique was performed to assess early pregnancy.  COMPARISON:  None.  FINDINGS: No intra or extrauterine gestational sac identified. No adnexal mass or complex pelvic fluid. The uterus is unremarkable. The ovaries are normal. A corpus luteum is noted on the left.  IMPRESSION: Pregnancy of unknown location. Differential considerations include intrauterine gestation too early to be sonographically visualized, spontaneous abortion, or ectopic pregnancy. Consider follow-up ultrasound in 10 days and serial quantitative beta HCG follow-up.   Electronically Signed   By: Tiburcio Pea M.D.   On: 01/26/2014 00:40   US Ob Transvaginal  02/02/2014   CLINICAL DATA:  Vaginal bleeding  EXAM: OBSTETRIC <14 WK [place] AND TRANSVAGINAL OB US  TECHNIQUE: Both transabdominal and transvaginal ultrasound examinations were performed for complete evaluation of the gestation as well as the maternal uterus, adnexal regions, and pelvic cul-de-sac. Transvaginal technique was performed to assess early pregnancy.  COMPARISON:  January 25, 2014  FINDINGS: Two gestational sacs are seen.  Two yolk sacs are seen.  Embryo:  Not visualized in either gestational sac  Each gestational sac has a diameter of 9 mm corresponding to a 6- week gestation.  Maternal uterus/adnexae: There is no demonstrable subchorionic hemorrhage. Cervical os is closed. Maternal right ovary measures 2.8 x 1.9 x 1.1 cm. Maternal left ovary measures 4.1 x 2.1 x 2.7 cm. There is a hypoechoic  area in the left ovary measuring 2.1 x 1.4 x 1.9 cm. No other extrauterine pelvic mass seen. There is a small amount of free pelvic fluid.  IMPRESSION: Two gestational sacs and yolk sacs seen, one yolk sac in each gestational sac. Appearance is consistent with early twin gestation. Fetal poles are not yet seen. Followup study in approximately 2 weeks advised to further assess.  Question hemorrhagic corpus luteum in the left ovary.   Electronically Signed   By: Bretta Bang M.D.   On: 02/02/2014 10:35   US Ob Transvaginal  01/26/2014   CLINICAL DATA:  Abdominal pain.  Possible pregnancy.  EXAM: OBSTETRIC <14 WK [place] AND TRANSVAGINAL OB US  TECHNIQUE: Both transabdominal and transvaginal ultrasound examinations were performed for complete evaluation of the gestation as well as the maternal uterus, adnexal regions, and pelvic cul-de-sac. Transvaginal technique was performed to assess early pregnancy.  COMPARISON:  None.  FINDINGS: No intra or extrauterine gestational sac identified. No adnexal mass or complex pelvic fluid. The uterus is unremarkable. The ovaries are normal. A corpus luteum is noted on the left.  IMPRESSION: Pregnancy of unknown location. Differential considerations include intrauterine gestation too early to be sonographically visualized, spontaneous abortion, or ectopic pregnancy. Consider follow-up ultrasound in 10 days and serial quantitative beta HCG follow-up.   Electronically Signed   By: Tiburcio Pea M.D.   On: 01/26/2014 00:40    MAU COURSE  ASSESSMENT 1. Vaginal bleeding in pregnancy, first trimester   -potentially due to hemorrhagic corpus luteal cyst.   PLAN Reassure patient re: status of pregnancy Give bleeding precautions and reasons for return. Discharge home Follow up for routine prenatal care and ultrasound in 10 days. 23 y.o. G2P0010 @ [redacted]w[redacted]d probable twin gestation and hemorrhagic cyst with vaginal bleeding. Stable for discharge without pain at this time. Discussed  with the patient in detail results of ultrasound from yesterday and signs and symptoms to look for as reason to return. She will remain on pelvic rest and will return for her already scheduled ultrasound in 10 days. She will return sooner for any problems.  I have assisted the student with the A/P and discussed all findings and plan with the patient.  Follow-up Information   Please follow up. (If symptoms worsen)        Medication List    ASK your doctor about these medications       acetaminophen 325 MG tablet  Commonly known as:  TYLENOL  Take 325 mg by mouth daily as needed for mild pain.     metroNIDAZOLE 500 MG tablet  Commonly known as:  FLAGYL  Take 500 mg by mouth 2 (two) times daily.     multivitamin-prenatal 27-0.8 MG Tabs tablet  Take 1 tablet by mouth daily.         Emerson Monte, Student-PA 02/03/2014  3:12 PM

## 2014-02-03 NOTE — MAU Note (Signed)
Patient presents with c/o vaginal bleeding.  Noticed scant brown spotting on toilet paper yesterday, 08/12.  Today has small amount of bright blood on pad.  Reports cramping in lower abdomen and back, pain of 8/10.

## 2014-02-03 NOTE — MAU Provider Note (Signed)
Attestation of Attending Supervision of Advanced Practitioner (PA/CNM/NP): Evaluation and management procedures were performed by the Advanced Practitioner under my supervision and collaboration.  I have reviewed the Advanced Practitioner's note and chart, and I agree with the management and plan.  Daci Stubbe, MD, FACOG Attending Obstetrician & Gynecologist Faculty Practice, [facility]   

## 2014-02-03 NOTE — MAU Note (Signed)
Was to have come in today for US. Started having brown d/c yesterday with 2 small clots, [facility] "saw 2 embryos".  Bleeding became red today, [facility]. Was triaged but not evaluated.  Did not want to wait in lobby - so came here.

## 2014-02-03 NOTE — Discharge Instructions (Signed)
Pelvic Rest °Pelvic rest is sometimes recommended for women when:  °· The placenta is partially or completely covering the opening of the cervix (placenta previa). °· There is bleeding between the uterine wall and the amniotic sac in the first trimester (subchorionic hemorrhage). °· The cervix begins to open without labor starting (incompetent cervix, cervical insufficiency). °· The labor is too early (preterm labor). °HOME CARE INSTRUCTIONS °· Do not have sexual intercourse, stimulation, or an orgasm. °· Do not use tampons, douche, or put anything in the vagina. °· Do not lift anything over 10 pounds (4.5 kg). °· Avoid strenuous activity or straining your pelvic muscles. °SEEK MEDICAL CARE IF:  °· You have any vaginal bleeding during pregnancy. Treat this as a potential emergency. °· You have cramping pain felt low in the stomach (stronger than menstrual cramps). °· You notice vaginal discharge (watery, mucus, or bloody). °· You have a low, dull backache. °· There are regular contractions or uterine tightening. °SEEK IMMEDIATE MEDICAL CARE IF: °You have vaginal bleeding and have placenta previa.  °Document Released: 10/05/2010 Document Revised: 09/02/2011 Document Reviewed: 10/05/2010 °ExitCare® Patient Information ©2015 [employer]. This information is not intended to replace advice given to you by your health care provider. Make sure you discuss any questions you have with your health care provider. ° °Vaginal Bleeding During Pregnancy, First Trimester °A small amount of bleeding (spotting) from the vagina is common in early pregnancy. Sometimes the bleeding is normal and is not a problem, and sometimes it is a sign of something serious. Be sure to tell your doctor about any bleeding from your vagina right away. °HOME CARE °· Watch your condition for any changes. °· Follow your doctor's instructions about how active you can be. °· If you are on bed rest: °¨ You may need to stay in bed and only get up to use the  bathroom. °¨ You may be allowed to do some activities. °¨ If you need help, make plans for someone to help you. °· Write down: °¨ The number of pads you use each day. °¨ How often you change pads. °¨ How soaked (saturated) your pads are. °· Do not use tampons. °· Do not douche. °· Do not have sex or orgasms until your doctor says it is okay. °· If you pass any tissue from your vagina, save the tissue so you can show it to your doctor. °· Only take medicines as told by your doctor. °· Do not take aspirin because it can make you bleed. °· Keep all follow-up visits as told by your doctor. °GET HELP IF:  °· You bleed from your vagina. °· You have cramps. °· You have labor pains. °· You have a fever that does not go away after you take medicine. °GET HELP RIGHT AWAY IF:  °· You have very bad cramps in your back or belly (abdomen). °· You pass large clots or tissue from your vagina. °· You bleed more. °· You feel light-headed or weak. °· You pass out (faint). °· You have chills. °· You are leaking fluid or have a gush of fluid from your vagina. °· You pass out while pooping (having a bowel movement). °MAKE SURE YOU: °· Understand these instructions. °· Will watch your condition. °· Will get help right away if you are not doing well or get worse. °Document Released: 10/25/2013 Document Reviewed: 02/15/2013 °ExitCare® Patient Information ©2015 [employer]. This information is not intended to replace advice given to you by your health care provider. Make   sure you discuss any questions you have with your health care provider. ° °

## 2014-02-03 NOTE — Progress Notes (Signed)
Patient called in to cancel US for today as [facility].  Per Dorathy KinsmanVirginia Smith, CNM please reschedule exam for 10 days from today for follow up viability.  Patient rescheduled for 02-14-14 at 7:30am.

## 2014-02-04 NOTE — ED Provider Notes (Signed)
Medical screening examination/treatment/procedure(s) were performed by non-physician practitioner and as supervising physician I was immediately available for consultation/collaboration.   EKG Interpretation None        Audree CamelScott T Chrisandra Wiemers, MD 02/04/14 1442

## 2014-02-11 ENCOUNTER — Encounter (HOSPITAL_COMMUNITY): Payer: Self-pay | Admitting: *Deleted

## 2014-02-11 ENCOUNTER — Inpatient Hospital Stay (HOSPITAL_COMMUNITY): Payer: BC Managed Care – PPO

## 2014-02-11 ENCOUNTER — Inpatient Hospital Stay (HOSPITAL_COMMUNITY)
Admission: AD | Admit: 2014-02-11 | Discharge: 2014-02-11 | Disposition: A | Discharge status: Home / Self Care | Payer: BC Managed Care – PPO | Source: Ambulatory Visit | Attending: Obstetrics & Gynecology | Admitting: Obstetrics & Gynecology

## 2014-02-11 DIAGNOSIS — O200 Threatened abortion: Secondary | ICD-10-CM

## 2014-02-11 DIAGNOSIS — O209 Hemorrhage in early pregnancy, unspecified: Secondary | ICD-10-CM | POA: Diagnosis present

## 2014-02-11 DIAGNOSIS — O43891 Other placental disorders, first trimester: Secondary | ICD-10-CM

## 2014-02-11 DIAGNOSIS — O208 Other hemorrhage in early pregnancy: Secondary | ICD-10-CM | POA: Diagnosis not present

## 2014-02-11 LAB — CBC
HEMATOCRIT: 33.2 % — AB (ref 36.0–46.0)
Hemoglobin: 11.3 g/dL — ABNORMAL LOW (ref 12.0–15.0)
MCH: 31.1 pg (ref 26.0–34.0)
MCHC: 34 g/dL (ref 30.0–36.0)
MCV: 91.5 fL (ref 78.0–100.0)
Platelets: 267 10*3/uL (ref 150–400)
RBC: 3.63 MIL/uL — AB (ref 3.87–5.11)
RDW: 12.1 % (ref 11.5–15.5)
WBC: 9.3 10*3/uL (ref 4.0–10.5)

## 2014-02-11 NOTE — MAU Provider Note (Signed)
Chief Complaint: Vaginal Bleeding   None    SUBJECTIVE HPI: Krystal Nguyen is a 23 y.o. G2P0010 at [redacted]w[redacted]d by LMP who presents to maternity admissions reporting vaginal bleeding with onset today.  She reports she felt like she was leaking fluid, went to the bathroom and saw some red in her underwear.  She came to the hospital and the bleeding had soaked through her underwear and on to her pants.  She was not wearing a pad prior to arrival in MAU.  A pad was placed upon arrival in MAU and changed ~30 minutes later after her ultrasound.  She had scant bleeding on this pad.  She denies LOF, vaginal itching/burning, urinary symptoms, h/a, dizziness, n/v, or fever/chills.   Past Medical History  Diagnosis Date  . Miscarriage   . Obesity    History reviewed. No pertinent past surgical history. History   Social History  . Marital Status: Single    Spouse Name: N/A    Number of Children: N/A  . Years of Education: N/A   Occupational History  . Not on file.   Social History Main Topics  . Smoking status: Never Smoker   . Smokeless tobacco: Not on file  . Alcohol Use: No     Comment: social  . Drug Use: No  . Sexual Activity: Yes    Birth Control/ Protection: None   Other Topics Concern  . Not on file   Social History Narrative  . No narrative on file   No current facility-administered medications on file prior to encounter.   Current Outpatient Prescriptions on File Prior to Encounter  Medication Sig Dispense Refill  . acetaminophen (TYLENOL) 325 MG tablet Take 325 mg by mouth daily as needed for mild pain.      . Prenatal Vit-Fe Fumarate-FA (MULTIVITAMIN-PRENATAL) 27-0.8 MG TABS tablet Take 1 tablet by mouth daily.        Allergies  Allergen Reactions  . Bactrim [Sulfamethoxazole-Tmp Ds] Hives and Itching    ROS: Pertinent items in HPI  OBJECTIVE Blood pressure 131/69, pulse 98, temperature 98.9 F (37.2 C), temperature source Oral, resp. rate 18, height 5\' 5"  (1.651 m),  weight 116.212 kg (256 lb 3.2 oz), last menstrual period 12/22/2013, SpO2 99.00%. GENERAL: Well-developed, well-nourished female in no acute distress.  HEENT: Normocephalic HEART: normal rate RESP: normal effort ABDOMEN: Soft, non-tender EXTREMITIES: Nontender, no edema NEURO: Alert and oriented SPECULUM EXAM: Deferred at pt request. Cultures done at previous visit.  In MAU, 2 pads with scant dark brown bleeding during 2 hours.    LAB RESULTS Results for orders placed during the hospital encounter of 02/11/14 (from the past 24 hour(s))  CBC     Status: Abnormal   Collection Time    02/11/14  5:50 PM      Result Value Ref Range   WBC 9.3  4.0 - 10.5 K/uL   RBC 3.63 (*) 3.87 - 5.11 MIL/uL   Hemoglobin 11.3 (*) 12.0 - 15.0 g/dL   HCT 16.1 (*) 09.6 - 04.5 %   MCV 91.5  78.0 - 100.0 fL   MCH 31.1  26.0 - 34.0 pg   MCHC 34.0  30.0 - 36.0 g/dL   RDW 40.9  81.1 - 91.4 %   Platelets 267  150 - 400 K/uL    IMAGING US Ob Comp Less 14 Wks  02/02/2014   CLINICAL DATA:  Vaginal bleeding  EXAM: OBSTETRIC <14 WK [place] AND TRANSVAGINAL OB US  TECHNIQUE: Both transabdominal and transvaginal  ultrasound examinations were performed for complete evaluation of the gestation as well as the maternal uterus, adnexal regions, and pelvic cul-de-sac. Transvaginal technique was performed to assess early pregnancy.  COMPARISON:  January 25, 2014  FINDINGS: Two gestational sacs are seen.  Two yolk sacs are seen.  Embryo:  Not visualized in either gestational sac  Each gestational sac has a diameter of 9 mm corresponding to a 6- week gestation.  Maternal uterus/adnexae: There is no demonstrable subchorionic hemorrhage. Cervical os is closed. Maternal right ovary measures 2.8 x 1.9 x 1.1 cm. Maternal left ovary measures 4.1 x 2.1 x 2.7 cm. There is a hypoechoic area in the left ovary measuring 2.1 x 1.4 x 1.9 cm. No other extrauterine pelvic mass seen. There is a small amount of free pelvic fluid.  IMPRESSION: Two  gestational sacs and yolk sacs seen, one yolk sac in each gestational sac. Appearance is consistent with early twin gestation. Fetal poles are not yet seen. Followup study in approximately 2 weeks advised to further assess.  Question hemorrhagic corpus luteum in the left ovary.   Electronically Signed   By: Bretta Bang M.D.   On: 02/02/2014 10:35   US Ob Comp Less 14 Wks  01/26/2014   CLINICAL DATA:  Abdominal pain.  Possible pregnancy.  EXAM: OBSTETRIC <14 WK [place] AND TRANSVAGINAL OB US  TECHNIQUE: Both transabdominal and transvaginal ultrasound examinations were performed for complete evaluation of the gestation as well as the maternal uterus, adnexal regions, and pelvic cul-de-sac. Transvaginal technique was performed to assess early pregnancy.  COMPARISON:  None.  FINDINGS: No intra or extrauterine gestational sac identified. No adnexal mass or complex pelvic fluid. The uterus is unremarkable. The ovaries are normal. A corpus luteum is noted on the left.  IMPRESSION: Pregnancy of unknown location. Differential considerations include intrauterine gestation too early to be sonographically visualized, spontaneous abortion, or ectopic pregnancy. Consider follow-up ultrasound in 10 days and serial quantitative beta HCG follow-up.   Electronically Signed   By: Tiburcio Pea M.D.   On: 01/26/2014 00:40   US Ob Transvaginal  02/11/2014   CLINICAL DATA:  Follow-up viability, bleeding  EXAM: US OB TRANSVAGINAL  COMPARISON:  02/02/2014  FINDINGS: TWIN 1  Intrauterine gestational sac: Visualized/normal in shape.  Yolk sac:  Visualized  Embryo:  Visualized  Cardiac Activity: Visualized  Heart Rate: 134 bpm  MSD:   mm    w     d  CRL:  6.2  mm   6 w 4 d                  [place] EDC: 10/03/2014  TWIN 2  Intrauterine gestational sac: Visualized/normal in shape.  Yolk sac:  Visualized  Embryo:  Visualized  Cardiac Activity: Visualized  Heart Rate: 124 bpm  MSD:   mm    w     d  CRL:  4.7  mm   6 w 2 d                  [place]  EDC: 10/05/2014  Maternal uterus/adnexae: Large subchorionic hemorrhage noted. No adnexal masses. No free fluid.  IMPRESSION: Twin pregnancy. Estimated gestational age [redacted] weeks 4 days. Fetal heart rates as above.  Large subchorionic hemorrhage.   Electronically Signed   By: Charlett Nose M.D.   On: 02/11/2014 17:12   US Ob Transvaginal  02/02/2014   CLINICAL DATA:  Vaginal bleeding  EXAM: OBSTETRIC <14 WK [place] AND TRANSVAGINAL OB US  TECHNIQUE: Both transabdominal and transvaginal ultrasound examinations were performed for complete evaluation of the gestation as well as the maternal uterus, adnexal regions, and pelvic cul-de-sac. Transvaginal technique was performed to assess early pregnancy.  COMPARISON:  January 25, 2014  FINDINGS: Two gestational sacs are seen.  Two yolk sacs are seen.  Embryo:  Not visualized in either gestational sac  Each gestational sac has a diameter of 9 mm corresponding to a 6- week gestation.  Maternal uterus/adnexae: There is no demonstrable subchorionic hemorrhage. Cervical os is closed. Maternal right ovary measures 2.8 x 1.9 x 1.1 cm. Maternal left ovary measures 4.1 x 2.1 x 2.7 cm. There is a hypoechoic area in the left ovary measuring 2.1 x 1.4 x 1.9 cm. No other extrauterine pelvic mass seen. There is a small amount of free pelvic fluid.  IMPRESSION: Two gestational sacs and yolk sacs seen, one yolk sac in each gestational sac. Appearance is consistent with early twin gestation. Fetal poles are not yet seen. Followup study in approximately 2 weeks advised to further assess.  Question hemorrhagic corpus luteum in the left ovary.   Electronically Signed   By: Bretta BangWilliam  Woodruff M.D.   On: 02/02/2014 10:35   [name] Ob Transvaginal  01/26/2014   CLINICAL DATA:  Abdominal pain.  Possible pregnancy.  EXAM: OBSTETRIC <14 WK US AND TRANSVAGINAL OB US  TECHNIQUE: Both transabdominal and transvaginal ultrasound examinations were performed for complete evaluation of the gestation as well as the  maternal uterus, adnexal regions, and pelvic cul-de-sac. Transvaginal technique was performed to assess early pregnancy.  COMPARISON:  None.  FINDINGS: No intra or extrauterine gestational sac identified. No adnexal mass or complex pelvic fluid. The uterus is unremarkable. The ovaries are normal. A corpus luteum is noted on the left.  IMPRESSION: Pregnancy of unknown location. Differential considerations include intrauterine gestation too early to be sonographically visualized, spontaneous abortion, or ectopic pregnancy. Consider follow-up ultrasound in 10 days and serial quantitative beta HCG follow-up.   Electronically Signed   By: Tiburcio PeaJonathan  Watts M.D.   On: 01/26/2014 00:40    ASSESSMENT 1. Threatened miscarriage in early pregnancy   2. Subchorionic hematoma, first trimester     PLAN Discharge home with bleeding precautions   Follow-up Information   [facility].   Specialty:  Obstetrics and Gynecology   Contact information:   [address] [place] [id number] [phone]      Follow [facility] with [facility]. (As needed for emergencies)    Contact information:   [address] [id number] [place] [id number] [phone]      Sharen CounterLisa Leftwich-Kirby Certified Nurse-Midwife 02/11/2014  8:04 PM

## 2014-02-11 NOTE — MAU Note (Signed)
Urine in lab 

## 2014-02-11 NOTE — Discharge Instructions (Signed)

## 2014-02-11 NOTE — MAU Note (Signed)
Patient states she started having bleeding at 1430. Denies pain.

## 2014-02-14 ENCOUNTER — Ambulatory Visit (HOSPITAL_COMMUNITY)
Admission: RE | Admit: 2014-02-14 | Discharge: 2014-02-14 | Disposition: A | Discharge status: Home / Self Care | Payer: BC Managed Care – PPO | Source: Ambulatory Visit | Attending: Gynecology | Admitting: Gynecology

## 2014-02-23 ENCOUNTER — Inpatient Hospital Stay (HOSPITAL_COMMUNITY)
Admission: AD | Admit: 2014-02-23 | Discharge: 2014-02-23 | Disposition: A | Discharge status: Home / Self Care | Payer: BC Managed Care – PPO | Source: Ambulatory Visit | Attending: Obstetrics and Gynecology | Admitting: Obstetrics and Gynecology

## 2014-02-23 ENCOUNTER — Other Ambulatory Visit: Payer: Self-pay | Admitting: Obstetrics and Gynecology

## 2014-02-23 ENCOUNTER — Encounter (HOSPITAL_COMMUNITY): Payer: Self-pay | Admitting: *Deleted

## 2014-02-23 ENCOUNTER — Inpatient Hospital Stay (HOSPITAL_COMMUNITY): Payer: BC Managed Care – PPO

## 2014-02-23 DIAGNOSIS — O039 Complete or unspecified spontaneous abortion without complication: Secondary | ICD-10-CM | POA: Diagnosis not present

## 2014-02-23 DIAGNOSIS — N83209 Unspecified ovarian cyst, unspecified side: Secondary | ICD-10-CM | POA: Diagnosis not present

## 2014-02-23 DIAGNOSIS — O209 Hemorrhage in early pregnancy, unspecified: Secondary | ICD-10-CM

## 2014-02-23 DIAGNOSIS — O34599 Maternal care for other abnormalities of gravid uterus, unspecified trimester: Secondary | ICD-10-CM | POA: Diagnosis not present

## 2014-02-23 DIAGNOSIS — O208 Other hemorrhage in early pregnancy: Secondary | ICD-10-CM | POA: Insufficient documentation

## 2014-02-23 DIAGNOSIS — O418X10 Other specified disorders of amniotic fluid and membranes, first trimester, not applicable or unspecified: Secondary | ICD-10-CM

## 2014-02-23 DIAGNOSIS — O4690 Antepartum hemorrhage, unspecified, unspecified trimester: Secondary | ICD-10-CM | POA: Insufficient documentation

## 2014-02-23 DIAGNOSIS — O468X1 Other antepartum hemorrhage, first trimester: Secondary | ICD-10-CM

## 2014-02-23 DIAGNOSIS — O30009 Twin pregnancy, unspecified number of placenta and unspecified number of amniotic sacs, unspecified trimester: Secondary | ICD-10-CM | POA: Diagnosis not present

## 2014-02-23 DIAGNOSIS — O30001 Twin pregnancy, unspecified number of placenta and unspecified number of amniotic sacs, first trimester: Secondary | ICD-10-CM

## 2014-02-23 NOTE — MAU Provider Note (Signed)
History   23 yo G2P0010 at 8 weeks with known twin gestation presented after calling office with persistent bleeding and new onset cramping.  Patient had last UC 8/21 at 6 4/7 weeks in MAU, with dx of large [facility].  Previous US on 02/02/14 showed two IUGS, with ? Hemorrhagic CL cyst in left ovary.   Patient has been evaluated in ER on 8/4, MAU 8/6, ER 8/12, MAU 8/13, and MAU 02/11/14.  O+ type, CBC WNL 8/21.  Seen for NOB at [facility] on 02/17/14.  No NOB labs or cultures done.  Patient Active Problem List   Diagnosis Date Noted  . Twin pregnancy 02/23/2014  . Subchorionic hemorrhage in first trimester 02/23/2014  . Hemorrhagic cyst of ovary--left ovary, noted on 1st trimester [place] 02/23/2014  . Vaginal bleeding in pregnancy 02/23/2014  Hx LEEP 2014 Allergic to Bactrim  Chief Complaint  Patient presents with  . Vaginal Bleeding   HPI:  As above  OB History   Grav Para Term Preterm Abortions TAB SAB Ect Mult Living   08/2013--SAB early 1st trimester.  Followed QHCGs to resolution in 09/2013.  Past Medical History  Diagnosis Date  . Miscarriage   . Obesity     No past surgical history on file.  Family History  Problem Relation Age of Onset  . Diabetes Maternal Grandmother   . Hypertension Maternal Grandmother   . Cancer Maternal Grandfather     History  Substance Use Topics  . Smoking status: Never Smoker   . Smokeless tobacco: Not on file  . Alcohol Use: No     Comment: social    Allergies:  Allergies  Allergen Reactions  . Bactrim [Sulfamethoxazole-Tmp Ds] Hives and Itching    Prescriptions prior to admission  Medication Sig Dispense Refill  . acetaminophen (TYLENOL) 325 MG tablet Take 325 mg by mouth daily as needed for mild pain.      . Prenatal Vit-Fe Fumarate-FA (MULTIVITAMIN-PRENATAL) 27-0.8 MG TABS tablet Take 1 tablet by mouth daily.         ROS:  Small amount vaginal bleeding Physical Exam   Blood pressure 123/76, pulse 82, temperature 98.9  F (37.2 C), resp. rate 18, height  (1.651 m), weight 252 lb 12.8 oz (114.669 kg), last menstrual period 12/22/2013, SpO2 99.00%.  Physical Exam Chest clear Heart RRR without murmur Abd soft, NT Pelvic--small amount dark blood in vault, cervix closed, uterus NT. Ext WNL  ED Course  Assessment: Twin gestation at 8 weeks Bleeding Hx large [facility]  Plan: [place] for viability   Nigel Bridgeman CNM, MSN 02/23/2014 10:06 AM  Addendum: Returned from US--Twin IUP, 8 4/7 weeks, Twin A FHR 170, Twin B 180. Large SCH noted, 4.6 x 1.4 x 3.4, decreased in volume from previous exam (5.9 x 1.6 x 3.7).  D/C'd home with bleeding precautions. To keep scheduled appt at [facility] on 03/07/14, call prn. 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  Nigel Bridgeman, CNM 02/23/14 1:50p

## 2014-02-23 NOTE — MAU Note (Signed)
Pt reports she started spotting a few days ago and started having more heavy bleeding this morning along with increased cramping. Passed some clots

## 2014-02-23 NOTE — Discharge Instructions (Signed)
Vaginal Bleeding During Pregnancy, First Trimester  A small amount of bleeding (spotting) from the vagina is relatively common in early pregnancy. It usually stops on its own. Various things may cause bleeding or spotting in early pregnancy. Some bleeding may be related to the pregnancy, and some may not. In most cases, the bleeding is normal and is not a problem. However, bleeding can also be a sign of something serious. Be sure to tell your health care provider about any vaginal bleeding right away.  Some possible causes of vaginal bleeding during the first trimester include:  · Infection or inflammation of the cervix.  · Growths (polyps) on the cervix.  · Miscarriage or threatened miscarriage.  · Pregnancy tissue has developed outside of the uterus and in a fallopian tube (tubal pregnancy).  · Tiny cysts have developed in the uterus instead of pregnancy tissue (molar pregnancy).  HOME CARE INSTRUCTIONS   Watch your condition for any changes. The following actions may help to lessen any discomfort you are feeling:  · Follow your health care provider's instructions for limiting your activity. If your health care provider orders bed rest, you may need to stay in bed and only get up to use the bathroom. However, your health care provider may allow you to continue light activity.  · If needed, make plans for someone to help with your regular activities and responsibilities while you are on bed rest.  · Keep track of the number of pads you use each day, how often you change pads, and how soaked (saturated) they are. Write this down.  · Do not use tampons. Do not douche.  · Do not have sexual intercourse or orgasms until approved by your health care provider.  · If you pass any tissue from your vagina, save the tissue so you can show it to your health care provider.  · Only take over-the-counter or prescription medicines as directed by your health care provider.  · Do not take aspirin because it can make you  bleed.  · Keep all follow-up appointments as directed by your health care provider.  SEEK MEDICAL CARE IF:  · You have any vaginal bleeding during any part of your pregnancy.  · You have cramps or labor pains.  · You have a fever, not controlled by medicine.  SEEK IMMEDIATE MEDICAL CARE IF:   · You have severe cramps in your back or belly (abdomen).  · You pass large clots or tissue from your vagina.  · Your bleeding increases.  · You feel light-headed or weak, or you have fainting episodes.  · You have chills.  · You are leaking fluid or have a gush of fluid from your vagina.  · You pass out while having a bowel movement.  MAKE SURE YOU:  · Understand these instructions.  · Will watch your condition.  · Will get help right away if you are not doing well or get worse.  Document Released: 03/20/2005 Document Revised: 06/15/2013 Document Reviewed: 02/15/2013  ExitCare® Patient Information ©2015 [employer]. This information is not intended to replace advice given to you by your health care provider. Make sure you discuss any questions you have with your health care provider.

## 2014-03-07 LAB — CULTURE, OB URINE: Urine Culture, OB: NEGATIVE

## 2014-03-08 LAB — OB RESULTS CONSOLE GC/CHLAMYDIA
Chlamydia: NEGATIVE
GC PROBE AMP, GENITAL: NEGATIVE

## 2014-03-08 LAB — OB RESULTS CONSOLE PLATELET COUNT: Platelets: 282 10*3/uL

## 2014-03-08 LAB — OB RESULTS CONSOLE HEPATITIS B SURFACE ANTIGEN: HEP B S AG: NEGATIVE

## 2014-03-08 LAB — OB RESULTS CONSOLE RUBELLA ANTIBODY, IGM: Rubella: IMMUNE

## 2014-03-08 LAB — OB RESULTS CONSOLE ANTIBODY SCREEN: ANTIBODY SCREEN: NEGATIVE

## 2014-03-23 ENCOUNTER — Encounter (HOSPITAL_COMMUNITY): Payer: Self-pay

## 2014-03-23 ENCOUNTER — Inpatient Hospital Stay (HOSPITAL_COMMUNITY)
Admission: AD | Admit: 2014-03-23 | Discharge: 2014-03-23 | Disposition: A | Discharge status: Home / Self Care | Payer: BC Managed Care – PPO | Source: Ambulatory Visit | Attending: Obstetrics and Gynecology | Admitting: Obstetrics and Gynecology

## 2014-03-23 ENCOUNTER — Inpatient Hospital Stay (HOSPITAL_COMMUNITY): Payer: BC Managed Care – PPO

## 2014-03-23 DIAGNOSIS — O30009 Twin pregnancy, unspecified number of placenta and unspecified number of amniotic sacs, unspecified trimester: Secondary | ICD-10-CM | POA: Diagnosis not present

## 2014-03-23 DIAGNOSIS — O30001 Twin pregnancy, unspecified number of placenta and unspecified number of amniotic sacs, first trimester: Secondary | ICD-10-CM

## 2014-03-23 DIAGNOSIS — O99891 Other specified diseases and conditions complicating pregnancy: Secondary | ICD-10-CM | POA: Diagnosis not present

## 2014-03-23 DIAGNOSIS — R109 Unspecified abdominal pain: Secondary | ICD-10-CM | POA: Diagnosis present

## 2014-03-23 DIAGNOSIS — O9989 Other specified diseases and conditions complicating pregnancy, childbirth and the puerperium: Principal | ICD-10-CM

## 2014-03-23 DIAGNOSIS — O208 Other hemorrhage in early pregnancy: Secondary | ICD-10-CM | POA: Insufficient documentation

## 2014-03-23 HISTORY — DX: Unspecified asthma, uncomplicated: J45.909

## 2014-03-23 LAB — URINE MICROSCOPIC-ADD ON

## 2014-03-23 LAB — URINALYSIS, ROUTINE W REFLEX MICROSCOPIC
BILIRUBIN URINE: NEGATIVE
Glucose, UA: NEGATIVE mg/dL
Ketones, ur: NEGATIVE mg/dL
Nitrite: NEGATIVE
PH: 6 (ref 5.0–8.0)
Protein, ur: NEGATIVE mg/dL
SPECIFIC GRAVITY, URINE: 1.01 (ref 1.005–1.030)
Urobilinogen, UA: 0.2 mg/dL (ref 0.0–1.0)

## 2014-03-23 LAB — WET PREP, GENITAL
CLUE CELLS WET PREP: NONE SEEN
TRICH WET PREP: NONE SEEN
YEAST WET PREP: NONE SEEN

## 2014-03-23 MED ORDER — NITROFURANTOIN MONOHYD MACRO 100 MG PO CAPS: 100.0000 mg | ORAL_CAPSULE | Freq: Two times a day (BID) | ORAL | Status: DC

## 2014-03-23 NOTE — MAU Provider Note (Signed)
Krystal MoanShardne Splinter is a 23 y.o. G2P0010 at 12.[redacted] weeks pregnant with twins present to MAU unannounced c/o cramping, lower back pain, white discharge and a head cold.  She denies vb, lob, or sob.  She has a hx of a loss and a sub chronic hemorrhage with this pregnancy.   History     Patient Active Problem List   Diagnosis Date Noted  . Twin pregnancy 02/23/2014  . Subchorionic hemorrhage in first trimester 02/23/2014  . Hemorrhagic cyst of ovary--left ovary, noted on 1st trimester US 02/23/2014  . Vaginal bleeding in pregnancy 02/23/2014  . SAB (spontaneous abortion)--08/2013 02/23/2014    Chief Complaint  Patient presents with  . Abdominal Pain  . Back Pain  . Chest Pain   HPI  OB History   Grav Para Term Preterm Abortions TAB SAB Ect Mult Living   2    1  1          Past Medical History  Diagnosis Date  . Miscarriage   . Obesity   . Medical history non-contributory   . Asthma     Past Surgical History  Procedure Laterality Date  . No past surgeries      Family History  Problem Relation Age of Onset  . Diabetes Maternal Grandmother   . Hypertension Maternal Grandmother   . Cancer Maternal Grandfather     History  Substance Use Topics  . Smoking status: Never Smoker   . Smokeless tobacco: Not on file  . Alcohol Use: No     Comment: social    Allergies:  Allergies  Allergen Reactions  . Bactrim [Sulfamethoxazole-Tmp Ds] Hives and Itching    Prescriptions prior to admission  Medication Sig Dispense Refill  . acetaminophen (TYLENOL) 325 MG tablet Take 325 mg by mouth daily as needed for mild pain.      . Prenatal Vit-Fe Fumarate-FA (PRENATAL MULTIVITAMIN) TABS tablet Take 1 tablet by mouth daily at 12 noon.      . sodium chloride (OCEAN) 0.65 % SOLN nasal spray Place 1 spray into both nostrils as needed for congestion.        ROS See HPI above, all other systems are negative  Physical Exam   Blood pressure 123/80, pulse 107, temperature 98.3 F (36.8  C), temperature source Oral, resp. rate 18, height 5' 4.6" (1.641 m), weight 114.306 kg (252 lb), last menstrual period 12/22/2013, SpO2 100.00%.  Physical Exam  Ext:  WNL ABD: Soft, non tender to palpation, no rebound or guarding SVE: wnl, closed Nasal congestion   ED Course  Assessment: IUP at  12.0 weeks Membranes: intact FHR: unable to get with dopplers CTX:  Cramping SAT 100%   Plan: Labs: wet prep, GC/CT, FLU US for viability   Kuron Docken, CNM, MSN 03/23/2014. 9:50 PM

## 2014-03-23 NOTE — MAU Note (Signed)
Pt c/o lower abdominal pain, back pain and chest pain x 2days. States that back pain and abdominal pain are cramping. Denies vag bleeding, but has a milky white discharge. Also has c/o cold like symptoms-sneezing, stuffy nose, headache, no fever or chills. Has been using saline nasal spray that has helped some. Chest pain is constant and tight. Does not radiate.

## 2014-03-23 NOTE — Discharge Instructions (Signed)
Urinary Tract Infection Urinary tract infections (UTIs) can develop anywhere along your urinary tract. Your urinary tract is your body's drainage system for removing wastes and extra water. Your urinary tract includes two kidneys, two ureters, a bladder, and a urethra. Your kidneys are a pair of bean-shaped organs. Each kidney is about the size of your fist. They are located below your ribs, one on each side of your spine. CAUSES Infections are caused by microbes, which are microscopic organisms, including fungi, viruses, and bacteria. These organisms are so small that they can only be seen through a microscope. Bacteria are the microbes that most commonly cause UTIs. SYMPTOMS  Symptoms of UTIs may vary by age and gender of the patient and by the location of the infection. Symptoms in young women typically include a frequent and intense urge to urinate and a painful, burning feeling in the bladder or urethra during urination. Older women and men are more likely to be tired, shaky, and weak and have muscle aches and abdominal pain. A fever may mean the infection is in your kidneys. Other symptoms of a kidney infection include pain in your back or sides below the ribs, nausea, and vomiting. DIAGNOSIS To diagnose a UTI, your caregiver will ask you about your symptoms. Your caregiver also will ask to provide a urine sample. The urine sample will be tested for bacteria and white blood cells. White blood cells are made by your body to help fight infection. TREATMENT  Typically, UTIs can be treated with medication. Because most UTIs are caused by a bacterial infection, they usually can be treated with the use of antibiotics. The choice of antibiotic and length of treatment depend on your symptoms and the type of bacteria causing your infection. HOME CARE INSTRUCTIONS  If you were prescribed antibiotics, take them exactly as your caregiver instructs you. Finish the medication even if you feel better after you  have only taken some of the medication.  Drink enough water and fluids to keep your urine clear or pale yellow.  Avoid caffeine, tea, and carbonated beverages. They tend to irritate your bladder.  Empty your bladder often. Avoid holding urine for long periods of time.  Empty your bladder before and after sexual intercourse.  After a bowel movement, women should cleanse from front to back. Use each tissue only once. SEEK MEDICAL CARE IF:   You have back pain.  You develop a fever.  Your symptoms do not begin to resolve within 3 days. SEEK IMMEDIATE MEDICAL CARE IF:   You have severe back pain or lower abdominal pain.  You develop chills.  You have nausea or vomiting.  You have continued burning or discomfort with urination. MAKE SURE YOU:   Understand these instructions.  Will watch your condition.  Will get help right away if you are not doing well or get worse. Document Released: 03/20/2005 Document Revised: 12/10/2011 Document Reviewed: 07/19/2011 [facility] Patient Information 2015 [place], [place]. This information is not intended to replace advice given to you by your health care provider. Make sure you discuss any questions you have with your health care provider. Multiple Pregnancy A multiple pregnancy is when a woman is pregnant with two or more fetuses. Multiple pregnancies occur in about 3% of all births. The more babies in a pregnancy, the greater the risks of problems to the babies and mother. This includes death. Since the use of Assisted Reproductive Technology (ART) and medications that can induce ovulation, multiple fetal gestation has increased.  RISKS TO  THE MOTHER  Preeclampsia and eclampsia.  Postpartum bleeding (hemorrhage).  Kidney infection (pyelonephritis).  Develop anemia.  Develop diabetes.  Liver complications.  A blood clot blocks the artery, or branch of the artery leading to the lungs (pulmonary embolism).  Blood clots in the  leg.  Placental separation.  Higher rate of Cesarean Section deliveries.  Women over 23 years old have a higher rate of Downs Syndrome babies. RISKS TO THE BABY  Preterm labor with a premature baby.  Very low birth weight babies that are less than 3 pounds, especially with triplets or mores.  Premature rupture of the membranes.  Twin to twin blood transfusion with one baby anemic and the other baby with too much blood in its system. There may also be heart failure.  With triplets or more, one of the babies is at high risk for cerebral palsy or other neurologic problem.  There is a higher incidence of fetal death. CARE OF MOTHERS WITH MULTIPLE FETAL GESTATION Multiple pregnancies need more care and special prenatal care.  You will see your caregiver more often.  You will have more tests including ultrasounds, nonstress tests and blood tests.  You will have special tests done called amniocentesis and a biophysical profile.  You may be hospitalized more often during the pregnancy.  You will be encouraged to eat a balanced and healthy diet with vitamin and mineral supplements as directed.  You will be asked to get more rest and sleep to keep up your energy.  You will be asked to restrict your daily activities, exercise, work, household chores and sexual activity.  If you have preterm labor with small babies, you will be given a steroid injection to help the babies lungs mature and do better when born.  The delivery may have to be by Cesarean delivery, especially if there are triplets or more.  The delivery should be in a hospital with an intensive care nursery and Neonatologists (pediatrician for high risk babies) to care for the newborn babies. HOME CARE INSTRUCTIONS   Follow the caregiver's recommendations regarding office visits, tests for you and the babies, diet, rest and medications.  Avoid a large amount of physical activity.  Arrange to have help after the babies  are born and when you go home from the hospital.  Take classes on how to care for multiple babies before you deliver them. SEEK IMMEDIATE MEDICAL CARE IF:   You develop a temperature of 102 F (38.9 C) or higher.  You are leaking fluid from the vagina.  You develop vaginal bleeding.  You develop uterine contractions.  You develop a severe headache, severe upper abdominal pain, visual problems or excessive swelling of your face, hands and feet.  You develop severe back pain or leg pain.  You develop severe tiredness.  You develop chest pain.  You have shortness of breath, fall down or pass out. Document Released: 03/19/2008 Document Revised: 09/02/2011 Document Reviewed: 05/14/2013 [facility] Patient Information 2015 [place], [place]. This information is not intended to replace advice given to you by your health care provider. Make sure you discuss any questions you have with your health care provider.

## 2014-03-23 NOTE — MAU Provider Note (Signed)
MAU Addendum Note  Results for orders placed during the hospital encounter of 03/23/14 (from the past 24 hour(s))  URINALYSIS, ROUTINE W REFLEX MICROSCOPIC     Status: Abnormal   Collection Time    03/23/14  8:55 PM      Result Value Ref Range   Color, Urine YELLOW  YELLOW   APPearance CLEAR  CLEAR   Specific Gravity, Urine 1.010  1.005 - 1.030   pH 6.0  5.0 - 8.0   Glucose, UA NEGATIVE  NEGATIVE mg/dL   Hgb urine dipstick MODERATE (*) NEGATIVE   Bilirubin Urine NEGATIVE  NEGATIVE   Ketones, ur NEGATIVE  NEGATIVE mg/dL   Protein, ur NEGATIVE  NEGATIVE mg/dL   Urobilinogen, UA 0.2  0.0 - 1.0 mg/dL   Nitrite NEGATIVE  NEGATIVE   Leukocytes, UA SMALL (*) NEGATIVE  URINE MICROSCOPIC-ADD ON     Status: Abnormal   Collection Time    03/23/14  8:55 PM      Result Value Ref Range   Squamous Epithelial / LPF FEW (*) RARE   WBC, UA 7-10  <3 WBC/hpf   RBC / HPF 11-20  <3 RBC/hpf   Bacteria, UA MANY (*) RARE  WET PREP, GENITAL     Status: Abnormal   Collection Time    03/23/14  9:47 PM      Result Value Ref Range   Yeast Wet Prep HPF POC NONE SEEN  NONE SEEN   Trich, Wet Prep NONE SEEN  NONE SEEN   Clue Cells Wet Prep HPF POC NONE SEEN  NONE SEEN   WBC, Wet Prep HPF POC MANY (*) NONE SEEN    A/P US Report: TWIN A  Intrauterine gestational sac: Visualized/normal in shape.  Yolk sac: No  Embryo: Present  Cardiac Activity: Present  Heart Rate: 160 bpm  CRL: 55.8 mm 12 w 2 d US EDC: October 03, 2014  TWIN 2  Intrauterine gestational sac: Visualized/normal in shape.  Yolk sac: No  Embryo: Present  Cardiac Activity: Present  Heart Rate: 160 bpm  CRL: 5.8 mm 12 w 3 d US EDC: October 02, 2014  Maternal uterus/adnexae: The previously demonstrated subchorionic  hemorrhage which was noted to be decreasing in size on the previous  study is no longer evident. The maternal right ovary is normal. The  maternal left ovary could not be demonstrated. There is no free  pelvic fluid.   IMPRESSION:  1. There is a viable twin gestation with gestational age of [redacted] weeks  2 days and 12 weeks 3 days respectively for twins A and B. The  estimated dates of are April 11th and April 10th respectively. The  subchorionic hemorrhage previously demonstrated is not visible  today.  2. The maternal right ovary is normal. The maternal left ovary was  not visualized.    UTI+ Influenza result pending  DC to home  Rx Macrobid 100mg  bid x7 days FU in the office on Sept 9, 2015 [facility]   Jaydalynn Olivero, CNM, MSN 03/23/2014. 10:29 PM

## 2014-03-24 LAB — INFLUENZA PANEL BY PCR (TYPE A & B)
H1N1FLUPCR: NOT DETECTED
Influenza A By PCR: NEGATIVE
Influenza B By PCR: NEGATIVE

## 2014-03-24 LAB — GC/CHLAMYDIA PROBE AMP
CT Probe RNA: NEGATIVE
GC Probe RNA: NEGATIVE

## 2014-03-27 ENCOUNTER — Inpatient Hospital Stay (HOSPITAL_COMMUNITY)
Admission: AD | Admit: 2014-03-27 | Discharge: 2014-03-28 | Disposition: A | Discharge status: Home / Self Care | Payer: BC Managed Care – PPO | Source: Ambulatory Visit | Attending: Obstetrics and Gynecology | Admitting: Obstetrics and Gynecology

## 2014-03-27 DIAGNOSIS — O23591 Infection of other part of genital tract in pregnancy, first trimester: Secondary | ICD-10-CM | POA: Insufficient documentation

## 2014-03-27 DIAGNOSIS — O30001 Twin pregnancy, unspecified number of placenta and unspecified number of amniotic sacs, first trimester: Secondary | ICD-10-CM | POA: Insufficient documentation

## 2014-03-27 DIAGNOSIS — Z3A13 13 weeks gestation of pregnancy: Secondary | ICD-10-CM | POA: Insufficient documentation

## 2014-03-27 DIAGNOSIS — N760 Acute vaginitis: Secondary | ICD-10-CM

## 2014-03-27 DIAGNOSIS — B9689 Other specified bacterial agents as the cause of diseases classified elsewhere: Secondary | ICD-10-CM

## 2014-03-28 ENCOUNTER — Encounter (HOSPITAL_COMMUNITY): Payer: Self-pay | Admitting: *Deleted

## 2014-03-28 ENCOUNTER — Inpatient Hospital Stay (HOSPITAL_COMMUNITY): Payer: BC Managed Care – PPO

## 2014-03-28 DIAGNOSIS — N760 Acute vaginitis: Secondary | ICD-10-CM

## 2014-03-28 DIAGNOSIS — B9689 Other specified bacterial agents as the cause of diseases classified elsewhere: Secondary | ICD-10-CM

## 2014-03-28 DIAGNOSIS — R109 Unspecified abdominal pain: Secondary | ICD-10-CM | POA: Diagnosis present

## 2014-03-28 DIAGNOSIS — O23591 Infection of other part of genital tract in pregnancy, first trimester: Secondary | ICD-10-CM | POA: Diagnosis not present

## 2014-03-28 DIAGNOSIS — O30001 Twin pregnancy, unspecified number of placenta and unspecified number of amniotic sacs, first trimester: Secondary | ICD-10-CM | POA: Diagnosis not present

## 2014-03-28 DIAGNOSIS — Z3A13 13 weeks gestation of pregnancy: Secondary | ICD-10-CM | POA: Diagnosis not present

## 2014-03-28 LAB — WET PREP, GENITAL
Trich, Wet Prep: NONE SEEN
Yeast Wet Prep HPF POC: NONE SEEN

## 2014-03-28 MED ORDER — METRONIDAZOLE 0.75 % VA GEL: 1.0000 | Freq: Every day | VAGINAL | Status: DC

## 2014-03-28 NOTE — MAU Provider Note (Signed)
History  23 yo G2P0010 @ 13.4 wks twin gestation presents unannounced to MAU w/ c/o vaginal spotting after wiping and mild abdominal cramping since 11 pm last evening. Seen in MAU 5 days ago for same. Denies recent intercourse or any other issues.  Gives h/o miscarriage in March of this year.   Patient Active Problem List   Diagnosis Date Noted  . BV (bacterial vaginosis) 03/28/2014  . Twin pregnancy 02/23/2014  . Vaginal bleeding in pregnancy 02/23/2014  . SAB (spontaneous abortion)--08/2013 02/23/2014    Chief Complaint  Patient presents with  . Vaginal Bleeding  . Abdominal Cramping   HPI See above OB History   Grav Para Term Preterm Abortions TAB SAB Ect Mult Living   2    1  1          Past Medical History  Diagnosis Date  . Miscarriage   . Obesity   . Medical history non-contributory   . Asthma     Past Surgical History  Procedure Laterality Date  . No past surgeries      Family History  Problem Relation Age of Onset  . Diabetes Maternal Grandmother   . Hypertension Maternal Grandmother   . Cancer Maternal Grandfather     History  Substance Use Topics  . Smoking status: Never Smoker   . Smokeless tobacco: Never Used  . Alcohol Use: No     Comment: social    Allergies:  Allergies  Allergen Reactions  . Bactrim [Sulfamethoxazole-Tmp Ds] Hives and Itching    No prescriptions prior to admission    ROS Spotting Cramping Physical Exam   No results found for this or any previous visit (from the past 24 hour(s)).CLINICAL DATA: Initial encounter for hemorrhage with  first-trimester pregnancy. Twin pregnancy.  EXAM:  TWIN OBSTETRIC <14WK US AND TRANSVAGINAL OB US  COMPARISON: 03/23/2014  FINDINGS:  TWIN 1  Embryo: Present  Cardiac Activity: Present  Heart Rate: 169 bpm  CRL: Not measured due to recent ultrasound 5 days ago  TWIN 2  Embryo: Present  Cardiac Activity: Present  Heart Rate: 172 bpm  CRL: Not measured due to recent ultrasound 5  days ago  Maternal uterus/adnexae: The previously seen subchorionic hemorrhage  has resolved.  IMPRESSION:  1. Unremarkable ultrasound of first trimester twin pregnancy.  2. No evidence for residual or recurrent subchorionic hemorrhage.  Electronically Signed  By: Gennette Pachris Mattern M.D.  On: 03/28/2014 02:07  Blood pressure 107/67, pulse 92, temperature 98.7 F (37.1 C), temperature source Oral, resp. rate 18, last menstrual period 12/22/2013.  Physical Exam Gen: NAD Abd: soft, NTND Pelvic: small amount of old dark blood noted; no active bleeding, malodorous. Sample obtained for wet prep and GC/CT FHR: Only one HR detected by doppler (160s) Ext: WNL ED Course  Assessment: BV Normal u/s   Plan: Reassurance Metrogel F/U in office as scheduled Bleeding precautions   Sherre Nguyen, Krystal Nicolaisen CNM, MS 03/30/2014 1:02 PM

## 2014-03-28 NOTE — Discharge Instructions (Signed)
Vaginal Bleeding During Pregnancy, Second Trimester °A small amount of bleeding (spotting) from the vagina is relatively common in pregnancy. It usually stops on its own. Various things can cause bleeding or spotting in pregnancy. Some bleeding may be related to the pregnancy, and some may not. Sometimes the bleeding is normal and is not a problem. However, bleeding can also be a sign of something serious. Be sure to tell your health care provider about any vaginal bleeding right away. °Some possible causes of vaginal bleeding during the second trimester include: °· Infection, inflammation, or growths on the cervix.   °· The placenta may be partially or completely covering the opening of the cervix inside the uterus (placenta previa). °· The placenta may have separated from the uterus (abruption of the placenta).   °· You may be having early (preterm) labor.   °· The cervix may not be strong enough to keep a baby inside the uterus (cervical insufficiency).   °· Tiny cysts may have developed in the uterus instead of pregnancy tissue (molar pregnancy).  °HOME CARE INSTRUCTIONS  °Watch your condition for any changes. The following actions may help to lessen any discomfort you are feeling: °· Follow your health care provider's instructions for limiting your activity. If your health care provider orders bed rest, you may need to stay in bed and only get up to use the bathroom. However, your health care provider may allow you to continue light activity. °· If needed, make plans for someone to help with your regular activities and responsibilities while you are on bed rest. °· Keep track of the number of pads you use each day, how often you change pads, and how soaked (saturated) they are. Write this down. °· Do not use tampons. Do not douche. °· Do not have sexual intercourse or orgasms until approved by your health care provider. °· If you pass any tissue from your vagina, save the tissue so you can show it to your  health care provider. °· Only take over-the-counter or prescription medicines as directed by your health care provider. °· Do not take aspirin because it can make you bleed. °· Do not exercise or perform any strenuous activities or heavy lifting without your health care provider's permission. °· Keep all follow-up appointments as directed by your health care provider. °SEEK MEDICAL CARE IF: °· You have any vaginal bleeding during any part of your pregnancy. °· You have cramps or labor pains. °· You have a fever, not controlled by medicine. °SEEK IMMEDIATE MEDICAL CARE IF:  °· You have severe cramps in your back or belly (abdomen). °· You have contractions. °· You have chills. °· You pass large clots or tissue from your vagina. °· Your bleeding increases. °· You feel light-headed or weak, or you have fainting episodes. °· You are leaking fluid or have a gush of fluid from your vagina. °MAKE SURE YOU: °· Understand these instructions. °· Will watch your condition. °· Will get help right away if you are not doing well or get worse. °Document Released: 03/20/2005 Document Revised: 06/15/2013 Document Reviewed: 02/15/2013 °ExitCare® Patient Information ©2015 [employer]. This information is not intended to replace advice given to you by your health care provider. Make sure you discuss any questions you have with your health care provider. ° °Bacterial Vaginosis °Bacterial vaginosis is a vaginal infection that occurs when the normal balance of bacteria in the vagina is disrupted. It results from an overgrowth of certain bacteria. This is the most common vaginal infection in women of childbearing   age. Treatment is important to prevent complications, especially in pregnant women, as it can cause a premature delivery. °CAUSES  °Bacterial vaginosis is caused by an increase in harmful bacteria that are normally present in smaller amounts in the vagina. Several different kinds of bacteria can cause bacterial vaginosis.  However, the reason that the condition develops is not fully understood. °RISK FACTORS °Certain activities or behaviors can put you at an increased risk of developing bacterial vaginosis, including: °· Having a new sex partner or multiple sex partners. °· Douching. °· Using an intrauterine device (IUD) for contraception. °Women do not get bacterial vaginosis from toilet seats, bedding, swimming pools, or contact with objects around them. °SIGNS AND SYMPTOMS  °Some women with bacterial vaginosis have no signs or symptoms. Common symptoms include: °· Grey vaginal discharge. °· A fishlike odor with discharge, especially after sexual intercourse. °· Itching or burning of the vagina and vulva. °· Burning or pain with urination. °DIAGNOSIS  °Your health care provider will take a medical history and examine the vagina for signs of bacterial vaginosis. A sample of vaginal fluid may be taken. Your health care provider will look at this sample under a microscope to check for bacteria and abnormal cells. A vaginal pH test may also be done.  °TREATMENT  °Bacterial vaginosis may be treated with antibiotic medicines. These may be given in the form of a pill or a vaginal cream. A second round of antibiotics may be prescribed if the condition comes back after treatment.  °HOME CARE INSTRUCTIONS  °· Only take over-the-counter or prescription medicines as directed by your health care provider. °· If antibiotic medicine was prescribed, take it as directed. Make sure you finish it even if you start to feel better. °· Do not have sex until treatment is completed. °· Tell all sexual partners that you have a vaginal infection. They should see their health care provider and be treated if they have problems, such as a mild rash or itching. °· Practice safe sex by using condoms and only having one sex partner. °SEEK MEDICAL CARE IF:  °· Your symptoms are not improving after 3 days of treatment. °· You have increased discharge or pain. °· You  have a fever. °MAKE SURE YOU:  °· Understand these instructions. °· Will watch your condition. °· Will get help right away if you are not doing well or get worse. °FOR MORE INFORMATION  °Centers for Disease Control and Prevention, Division of STD Prevention: www.cdc.gov/std °American Sexual Health Association (ASHA): www.ashastd.org  °Document Released: 06/10/2005 Document Revised: 03/31/2013 Document Reviewed: 01/20/2013 °ExitCare® Patient Information ©2015 [employer]. This information is not intended to replace advice given to you by your health care provider. Make sure you discuss any questions you have with your health care provider. ° °

## 2014-03-28 NOTE — MAU Note (Signed)
Red spotting on toilet paper around 11pm tonight. Mild abdominal cramping since signing into MAU>

## 2014-03-29 LAB — GC/CHLAMYDIA PROBE AMP
CT Probe RNA: NEGATIVE
GC PROBE AMP APTIMA: NEGATIVE

## 2014-04-21 ENCOUNTER — Encounter (HOSPITAL_COMMUNITY): Payer: Self-pay | Admitting: *Deleted

## 2014-04-21 ENCOUNTER — Inpatient Hospital Stay (HOSPITAL_COMMUNITY)
Admission: AD | Admit: 2014-04-21 | Discharge: 2014-04-21 | Disposition: A | Discharge status: Home / Self Care | Payer: BC Managed Care – PPO | Source: Ambulatory Visit | Attending: Obstetrics and Gynecology | Admitting: Obstetrics and Gynecology

## 2014-04-21 DIAGNOSIS — O26892 Other specified pregnancy related conditions, second trimester: Secondary | ICD-10-CM | POA: Insufficient documentation

## 2014-04-21 DIAGNOSIS — N898 Other specified noninflammatory disorders of vagina: Secondary | ICD-10-CM

## 2014-04-21 DIAGNOSIS — O30002 Twin pregnancy, unspecified number of placenta and unspecified number of amniotic sacs, second trimester: Secondary | ICD-10-CM | POA: Diagnosis not present

## 2014-04-21 DIAGNOSIS — Z3A17 17 weeks gestation of pregnancy: Secondary | ICD-10-CM | POA: Diagnosis not present

## 2014-04-21 DIAGNOSIS — O26891 Other specified pregnancy related conditions, first trimester: Secondary | ICD-10-CM

## 2014-04-21 LAB — URINALYSIS, ROUTINE W REFLEX MICROSCOPIC
BILIRUBIN URINE: NEGATIVE
GLUCOSE, UA: NEGATIVE mg/dL
HGB URINE DIPSTICK: NEGATIVE
KETONES UR: NEGATIVE mg/dL
Leukocytes, UA: NEGATIVE
Nitrite: NEGATIVE
PROTEIN: NEGATIVE mg/dL
Specific Gravity, Urine: 1.02 (ref 1.005–1.030)
UROBILINOGEN UA: 0.2 mg/dL (ref 0.0–1.0)
pH: 6.5 (ref 5.0–8.0)

## 2014-04-21 LAB — WET PREP, GENITAL
CLUE CELLS WET PREP: NONE SEEN
Trich, Wet Prep: NONE SEEN
YEAST WET PREP: NONE SEEN

## 2014-04-21 MED ORDER — DOCUSATE SODIUM 100 MG PO CAPS: 100.0000 mg | ORAL_CAPSULE | Freq: Two times a day (BID) | ORAL | Status: DC | PRN

## 2014-04-21 NOTE — MAU Provider Note (Signed)
History    Krystal Nguyen is a 23 y.o. G2P0010 at 8616.1wks with twin gestation, who presents for vaginal discharge.  Patient reports discharge started today and that she has a history of bacterial vaginosis.  Patient also reporting pain at umbilicus that is intermittent.  Patient denies issues with urination and reports ongoing constipation.  Patient also reports "butterfly feeling" in stomach, while denying VB, LOF, itching, and foul odor.  Patient also states that she has not attempted any OTC medication for pain as "tylenol makes me throw up."     Patient Active Problem List   Diagnosis Date Noted  . BV (bacterial vaginosis) 03/28/2014  . Twin pregnancy 02/23/2014  . Vaginal bleeding in pregnancy 02/23/2014  . SAB (spontaneous abortion)--08/2013 02/23/2014    No chief complaint on file.  HPI  OB History   Grav Para Term Preterm Abortions TAB SAB Ect Mult Living   2    1  1          Past Medical History  Diagnosis Date  . Miscarriage   . Obesity   . Medical history non-contributory   . Asthma     Past Surgical History  Procedure Laterality Date  . No past surgeries      Family History  Problem Relation Age of Onset  . Diabetes Maternal Grandmother   . Hypertension Maternal Grandmother   . Cancer Maternal Grandfather     History  Substance Use Topics  . Smoking status: Never Smoker   . Smokeless tobacco: Never Used  . Alcohol Use: No     Comment: social    Allergies:  Allergies  Allergen Reactions  . Bactrim [Sulfamethoxazole-Tmp Ds] Hives and Itching    Prescriptions prior to admission  Medication Sig Dispense Refill  . acetaminophen (TYLENOL) 325 MG tablet Take 325 mg by mouth daily as needed for mild pain.      . metroNIDAZOLE (METROGEL VAGINAL) 0.75 % vaginal gel Place 1 Applicatorful vaginally at bedtime. For 5 nights  70 g  0  . nitrofurantoin, macrocrystal-monohydrate, (MACROBID) 100 MG capsule Take 1 capsule (100 mg total) by mouth 2 (two) times  daily.  14 capsule  0  . OVER THE COUNTER MEDICATION Take 2 tablets by mouth daily. Prenatal Gummies.      . [DISCONTINUED] sodium chloride (OCEAN) 0.65 % SOLN nasal spray Place 1 spray into both nostrils as needed for congestion.        ROS  See HPI Above Physical Exam   Blood pressure 108/69, pulse 98, temperature 99 F (37.2 C), temperature source Oral, resp. rate 20, height 5\' 4"  (1.626 m), weight 247 lb 4 oz (112.152 kg), last menstrual period 12/22/2013. Results for orders placed during the hospital encounter of 04/21/14 (from the past 24 hour(s))  URINALYSIS, ROUTINE W REFLEX MICROSCOPIC     Status: None   Collection Time    04/21/14  8:05 PM      Result Value Ref Range   Color, Urine YELLOW  YELLOW   APPearance CLEAR  CLEAR   Specific Gravity, Urine 1.020  1.005 - 1.030   pH 6.5  5.0 - 8.0   Glucose, UA NEGATIVE  NEGATIVE mg/dL   Hgb urine dipstick NEGATIVE  NEGATIVE   Bilirubin Urine NEGATIVE  NEGATIVE   Ketones, ur NEGATIVE  NEGATIVE mg/dL   Protein, ur NEGATIVE  NEGATIVE mg/dL   Urobilinogen, UA 0.2  0.0 - 1.0 mg/dL   Nitrite NEGATIVE  NEGATIVE   Leukocytes, UA NEGATIVE  NEGATIVE  WET PREP, GENITAL     Status: Abnormal   Collection Time    04/21/14  8:39 PM      Result Value Ref Range   Yeast Wet Prep HPF POC NONE SEEN  NONE SEEN   Trich, Wet Prep NONE SEEN  NONE SEEN   Clue Cells Wet Prep HPF POC NONE SEEN  NONE SEEN   WBC, Wet Prep HPF POC FEW (*) NONE SEEN   Physical Exam  Constitutional: She is oriented to person, place, and time. She appears well-developed and well-nourished.  Cardiovascular: Normal rate, regular rhythm and normal heart sounds.   Respiratory: Effort normal and breath sounds normal.  GI: Soft. Bowel sounds are normal. There is tenderness in the periumbilical area. There is no CVA tenderness.     Genitourinary: Rectum normal. Uterus is enlarged. Cervix exhibits no motion tenderness. Vaginal discharge found.  Sterile Speculum Exam: -Vaginal  Vault: Thick white discharge noted-wet prep collected -Cervix: GC/CT collected -Bimanual Exam: Closed/Long/Thick   Musculoskeletal: Normal range of motion.  Neurological: She is alert and oriented to person, place, and time.  Skin: Skin is warm and dry.    FHR: Twin A: 159, Twin B: 169 ED Course  Assessment: IUP at 16.1wks Twin Gestation Constipation Vaginal Discharge  Plan: -Labs: UA, Wet Prep, Gc/CT -Patient declines pain medication at current -Will prescribe stool softener   Follow Up (2145) -Results reviewed-WNL -Discussed and educated regarding abdominal pain, constipation mgmt, iron supplementation, and vaginal hygiene -Bleeding Precautions  -Healthy Nutritional Intake -Keep appt as scheduled: Apr 27, 2014 -Encouraged to call if any questions or concerns arise prior to next scheduled office visit.   Cayenne Breault LYNN CNM, MSN 04/21/2014 9:06 PM

## 2014-04-21 NOTE — Discharge Instructions (Signed)
Constipation °Constipation is when a person has fewer than three bowel movements a week, has difficulty having a bowel movement, or has stools that are dry, hard, or larger than normal. As people grow older, constipation is more common. If you try to fix constipation with medicines that make you have a bowel movement (laxatives), the problem may get worse. Long-term laxative use may cause the muscles of the colon to become weak. A low-fiber diet, not taking in enough fluids, and taking certain medicines may make constipation worse.  °CAUSES  °· Certain medicines, such as antidepressants, pain medicine, iron supplements, antacids, and water pills.   °· Certain diseases, such as diabetes, irritable bowel syndrome (IBS), thyroid disease, or depression.   °· Not drinking enough water.   °· Not eating enough fiber-rich foods.   °· Stress or travel.   °· Lack of physical activity or exercise.   °· Ignoring the urge to have a bowel movement.   °· Using laxatives too much.   °SIGNS AND SYMPTOMS  °· Having fewer than three bowel movements a week.   °· Straining to have a bowel movement.   °· Having stools that are hard, dry, or larger than normal.   °· Feeling full or bloated.   °· Pain in the lower abdomen.   °· Not feeling relief after having a bowel movement.   °DIAGNOSIS  °Your health care provider will take a medical history and perform a physical exam. Further testing may be done for severe constipation. Some tests may include: °· A barium enema X-ray to examine your rectum, colon, and, sometimes, your small intestine.   °· A sigmoidoscopy to examine your lower colon.   °· A colonoscopy to examine your entire colon. °TREATMENT  °Treatment will depend on the severity of your constipation and what is causing it. Some dietary treatments include drinking more fluids and eating more fiber-rich foods. Lifestyle treatments may include regular exercise. If these diet and lifestyle recommendations do not help, your health care  provider may recommend taking over-the-counter laxative medicines to help you have bowel movements. Prescription medicines may be prescribed if over-the-counter medicines do not work.  °HOME CARE INSTRUCTIONS  °· Eat foods that have a lot of fiber, such as fruits, vegetables, whole grains, and beans. °· Limit foods high in fat and processed sugars, such as french fries, hamburgers, cookies, candies, and soda.   °· A fiber supplement may be added to your diet if you cannot get enough fiber from foods.   °· Drink enough fluids to keep your urine clear or pale yellow.   °· Exercise regularly or as directed by your health care provider.   °· Go to the restroom when you have the urge to go. Do not hold it.   °· Only take over-the-counter or prescription medicines as directed by your health care provider. Do not take other medicines for constipation without talking to your health care provider first.   °SEEK IMMEDIATE MEDICAL CARE IF:  °· You have bright red blood in your stool.   °· Your constipation lasts for more than 4 days or gets worse.   °· You have abdominal or rectal pain.   °· You have thin, pencil-like stools.   °· You have unexplained weight loss. °MAKE SURE YOU:  °· Understand these instructions. °· Will watch your condition. °· Will get help right away if you are not doing well or get worse. °Document Released: 03/08/2004 Document Revised: 06/15/2013 Document Reviewed: 03/22/2013 °ExitCare® Patient Information ©2015 [employer]. This information is not intended to replace advice given to you by your health care provider. Make sure you discuss any questions   you have with your health care provider. °Abdominal Pain During Pregnancy °Abdominal pain is common in pregnancy. Most of the time, it does not cause harm. There are many causes of abdominal pain. Some causes are more serious than others. Some of the causes of abdominal pain in pregnancy are easily diagnosed. Occasionally, the diagnosis takes time to  understand. Other times, the cause is not determined. Abdominal pain can be a sign that something is very wrong with the pregnancy, or the pain may have nothing to do with the pregnancy at all. For this reason, always tell your health care provider if you have any abdominal discomfort. °HOME CARE INSTRUCTIONS  °Monitor your abdominal pain for any changes. The following actions may help to alleviate any discomfort you are experiencing: °· Do not have sexual intercourse or put anything in your vagina until your symptoms go away completely. °· Get plenty of rest until your pain improves. °· Drink clear fluids if you feel nauseous. Avoid solid food as long as you are uncomfortable or nauseous. °· Only take over-the-counter or prescription medicine as directed by your health care provider. °· Keep all follow-up appointments with your health care provider. °SEEK IMMEDIATE MEDICAL CARE IF: °· You are bleeding, leaking fluid, or passing tissue from the vagina. °· You have increasing pain or cramping. °· You have persistent vomiting. °· You have painful or bloody urination. °· You have a fever. °· You notice a decrease in your baby's movements. °· You have extreme weakness or feel faint. °· You have shortness of breath, with or without abdominal pain. °· You develop a severe headache with abdominal pain. °· You have abnormal vaginal discharge with abdominal pain. °· You have persistent diarrhea. °· You have abdominal pain that continues even after rest, or gets worse. °MAKE SURE YOU:  °· Understand these instructions. °· Will watch your condition. °· Will get help right away if you are not doing well or get worse. °Document Released: 06/10/2005 Document Revised: 03/31/2013 Document Reviewed: 01/07/2013 °ExitCare® Patient Information ©2015 [employer]. This information is not intended to replace advice given to you by your health care provider. Make sure you discuss any questions you have with your health care provider. ° °

## 2014-04-21 NOTE — MAU Note (Addendum)
PT SAYS SHE HAS PRESSURE AT UMBILICUS  - STARTED LAST NIGHT AT 10PM-  STOPPED  AT 0200.    THEN STARTED  AGAIN AT 6PM.   ALSO  HAS VAG D/C-  WHITE  -  STARTED  AT 6 PM.   WHEN   SHE HAD BM-  VAG D/C LOOKED YELLOW.       FEELS CRAMPING IN   RECTAL  AREA.      NO VOMITING TODAY -  BUT NAUSEA  -  STARTED  AT 6 PM-  [facility]  LAST SEEN IN  [facility]-   ON  10-  21       NEXT APPOINTMENT   IS 11-4.       LAST SEX-    Monday   PT  SAYS  SHE CALLED [facility]  AND LEFT MESSAGE-  THAT   SHE WAS COMING   IN

## 2014-04-22 LAB — GC/CHLAMYDIA PROBE AMP
CT PROBE, AMP APTIMA: NEGATIVE
GC Probe RNA: NEGATIVE

## 2014-04-25 ENCOUNTER — Encounter (HOSPITAL_COMMUNITY): Payer: Self-pay | Admitting: *Deleted

## 2014-05-03 ENCOUNTER — Inpatient Hospital Stay (HOSPITAL_COMMUNITY)
Admission: AD | Admit: 2014-05-03 | Discharge: 2014-05-03 | Disposition: A | Discharge status: Home / Self Care | Payer: BC Managed Care – PPO | Source: Ambulatory Visit | Attending: Obstetrics and Gynecology | Admitting: Obstetrics and Gynecology

## 2014-05-03 ENCOUNTER — Encounter (HOSPITAL_COMMUNITY): Payer: Self-pay | Admitting: *Deleted

## 2014-05-03 DIAGNOSIS — N949 Unspecified condition associated with female genital organs and menstrual cycle: Secondary | ICD-10-CM | POA: Diagnosis not present

## 2014-05-03 DIAGNOSIS — O30002 Twin pregnancy, unspecified number of placenta and unspecified number of amniotic sacs, second trimester: Secondary | ICD-10-CM | POA: Diagnosis present

## 2014-05-03 DIAGNOSIS — O318X22 Other complications specific to multiple gestation, second trimester, fetus 2: Secondary | ICD-10-CM | POA: Insufficient documentation

## 2014-05-03 DIAGNOSIS — O30041 Twin pregnancy, dichorionic/diamniotic, first trimester: Secondary | ICD-10-CM

## 2014-05-03 DIAGNOSIS — Z3A17 17 weeks gestation of pregnancy: Secondary | ICD-10-CM | POA: Diagnosis not present

## 2014-05-03 LAB — AMNISURE RUPTURE OF MEMBRANE (ROM) NOT AT ARMC: Amnisure ROM: NEGATIVE

## 2014-05-03 NOTE — MAU Note (Signed)
Patient states she was seen in the office today for a regular visit. States she has been having lower abdominal pressure since Friday off and on. Denies bleeding or discharge. Sent to MAU to evaluate for decrease fluid in twin B.

## 2014-05-03 NOTE — MAU Provider Note (Signed)
History    Krystal Nguyen is a 23y.o. G2P0010 at 18.Krystal Nguyen with twin gestation.  Patient presents, from the office, after ultrasound revealed decreased AFI on Twin B.  Patient also reports pelvic pressure, but states she has had a pelvic exam as well as urine cultures yesterday in the office.     Patient Active Problem List   Diagnosis Date Noted  . BV (bacterial vaginosis) 03/28/2014  . Twin pregnancy 02/23/2014  . Vaginal bleeding in pregnancy 02/23/2014  . SAB (spontaneous abortion)--08/2013 02/23/2014    Chief Complaint  Patient presents with  . Vaginal Discharge   HPI  OB History    Gravida Para Term Preterm AB TAB SAB Ectopic Multiple Living   2    1  1          Past Medical History  Diagnosis Date  . Miscarriage   . Obesity   . Medical history non-contributory   . Asthma     Past Surgical History  Procedure Laterality Date  . No past surgeries      Family History  Problem Relation Age of Onset  . Diabetes Maternal Grandmother   . Hypertension Maternal Grandmother   . Cancer Maternal Grandfather     History  Substance Use Topics  . Smoking status: Never Smoker   . Smokeless tobacco: Never Used  . Alcohol Use: No     Comment: social    Allergies:  Allergies  Allergen Reactions  . Bactrim [Sulfamethoxazole-Trimethoprim] Hives and Itching    Prescriptions prior to admission  Medication Sig Dispense Refill Last Dose  . alum & mag hydroxide-simeth (MAALOX/MYLANTA) 200-200-20 MG/5ML suspension Take 15 mLs by mouth every 6 (six) hours as needed for indigestion or heartburn.   Past Month at Unknown time  . docusate sodium (COLACE) 100 MG capsule Take 1 capsule (100 mg total) by mouth 2 (two) times daily as needed for mild constipation. 30 capsule 2 Past Week at Unknown time  . Ferrous Sulfate (SLOW FE PO) Take 1 tablet by mouth daily.   05/03/2014 at Unknown time  . OVER THE COUNTER MEDICATION Take 2 tablets by mouth daily. Prenatal Gummies.   05/03/2014 at  Unknown time  . acetaminophen (TYLENOL) 325 MG tablet Take 325 mg by mouth daily as needed for mild pain.   Past Month at Unknown time    ROS  See HPI Above Physical Exam   Blood pressure 118/75, pulse 102, temperature 98.7 F (37.1 C), temperature source Oral, resp. rate 16, height 5\' 6"  (1.676 m), weight 248 lb 3.2 oz (112.583 kg), last menstrual period 12/22/2013, SpO2 99 %.  Physical Exam  Constitutional: She is oriented to person, place, and time. She appears well-developed and well-nourished.  Cardiovascular: Normal rate and regular rhythm.   Respiratory: Effort normal.  GI: Soft.  Neurological: She is alert and oriented to person, place, and time.  Skin: Skin is warm and dry.    ED Course  Assessment: IUP at 17.6wks Twin Gestation Decreased Fluid Twin B R/O Rupture Pelvic Pain   Plan: -Amnisure-Negative -Await urine cultures from office -Pt to follow up with MFM regarding US results -Keep appt as scheduled:  -Encouraged to call if any questions or concerns arise prior to next scheduled office visit.  -Dr. Katharine LookJ. Ozan consulted and agrees with plan -Discharged to home in stable condition  Michele Kerlin LYNN CNM, MSN 05/03/2014 8:59 PM

## 2014-05-03 NOTE — Progress Notes (Signed)
[facility] CNM notified of pt's arrival.

## 2014-05-03 NOTE — Discharge Instructions (Signed)

## 2014-05-04 ENCOUNTER — Other Ambulatory Visit (HOSPITAL_COMMUNITY): Payer: Self-pay | Admitting: Family Medicine

## 2014-05-04 DIAGNOSIS — Z3689 Encounter for other specified antenatal screening: Secondary | ICD-10-CM

## 2014-05-04 DIAGNOSIS — IMO0001 Reserved for inherently not codable concepts without codable children: Secondary | ICD-10-CM

## 2014-05-04 DIAGNOSIS — O283 Abnormal ultrasonic finding on antenatal screening of mother: Secondary | ICD-10-CM

## 2014-05-06 ENCOUNTER — Other Ambulatory Visit (HOSPITAL_COMMUNITY): Payer: Self-pay | Admitting: Family Medicine

## 2014-05-06 ENCOUNTER — Ambulatory Visit (HOSPITAL_COMMUNITY)
Admission: RE | Admit: 2014-05-06 | Discharge: 2014-05-06 | Disposition: A | Discharge status: Home / Self Care | Payer: BC Managed Care – PPO | Source: Ambulatory Visit | Attending: Obstetrics and Gynecology | Admitting: Obstetrics and Gynecology

## 2014-05-06 ENCOUNTER — Encounter (HOSPITAL_COMMUNITY): Payer: Self-pay

## 2014-05-06 ENCOUNTER — Ambulatory Visit (HOSPITAL_COMMUNITY)
Admission: RE | Admit: 2014-05-06 | Discharge: 2014-05-06 | Disposition: A | Discharge status: Home / Self Care | Payer: BC Managed Care – PPO | Source: Ambulatory Visit | Attending: Family Medicine | Admitting: Family Medicine

## 2014-05-06 DIAGNOSIS — O289 Unspecified abnormal findings on antenatal screening of mother: Secondary | ICD-10-CM | POA: Diagnosis not present

## 2014-05-06 DIAGNOSIS — O283 Abnormal ultrasonic finding on antenatal screening of mother: Secondary | ICD-10-CM

## 2014-05-06 DIAGNOSIS — Z3A00 Weeks of gestation of pregnancy not specified: Secondary | ICD-10-CM | POA: Diagnosis not present

## 2014-05-06 DIAGNOSIS — IMO0001 Reserved for inherently not codable concepts without codable children: Secondary | ICD-10-CM

## 2014-05-06 DIAGNOSIS — O30009 Twin pregnancy, unspecified number of placenta and unspecified number of amniotic sacs, unspecified trimester: Secondary | ICD-10-CM | POA: Diagnosis not present

## 2014-05-06 DIAGNOSIS — Z36 Encounter for antenatal screening of mother: Secondary | ICD-10-CM | POA: Insufficient documentation

## 2014-05-06 DIAGNOSIS — Z3A19 19 weeks gestation of pregnancy: Secondary | ICD-10-CM | POA: Insufficient documentation

## 2014-05-06 DIAGNOSIS — Z3689 Encounter for other specified antenatal screening: Secondary | ICD-10-CM

## 2014-05-06 DIAGNOSIS — IMO0002 Reserved for concepts with insufficient information to code with codable children: Secondary | ICD-10-CM | POA: Insufficient documentation

## 2014-05-09 ENCOUNTER — Telehealth (HOSPITAL_COMMUNITY): Payer: Self-pay | Admitting: MS"

## 2014-05-09 ENCOUNTER — Other Ambulatory Visit (HOSPITAL_COMMUNITY): Payer: Self-pay

## 2014-05-09 NOTE — Progress Notes (Addendum)
Genetic Counseling  High-Risk Gestation Note  Appointment Date:  05/06/2014 Referred By: Delma Officer, FNP Date of Birth:  1990/12/17 Partner:  [name]   Pregnancy History: [id number] Estimated Date of Delivery: 09/28/14 Estimated Gestational Age: 22w2dAttending: PBenjaman Lobe MD   I met with Ms. SDawson Billsand her partner Mr. Krystal "Nguyen for genetic counseling because of abnormal ultrasound findings in twin B in the dichorionic/diamniotic twin gestation. [employer] [facility] Counsel[facility] Intern, VSantiago Bumpers assisted with genetic counseling under my direct supervision.   We began by reviewing the ultrasound in detail. Ultrasound visualized a dichorionic/diamniotic twin gestation. 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  The bladder was never definitively visualized, and the fetal heart appears enlarged.  Normal fetal anatomic survey was visualized for twin A. Complete ultrasound results reported separately.   We discussed that congenital anomalies can occur as isolated, nonsyndromic birth defects, or as features of an underlying genetic syndrome.  The risk for a genetic etiology increases with the presence of multiple fetal anatomic differences.  In general we introduced the idea of possible underlying etiologies including chromosome conditions (such as fetal trisomy or other chromosome aberrations), single gene conditions, and teratogenic exposures. Approximately 10-15% of fetuses with bilateral renal anomalies have an abnormal karyotype.  Single gene conditions are typically tested for postnatally, based on the recommendation of a medical geneticist, unless ultrasound  findings or the family history are strongly suggestive of a specific syndrome.  We discussed that multiple congenital anomalies can also result from teratogenic exposures.  Mrs. Krystal Nguyen denied the use of illicit substances, medications, or alcohol during this pregnancy. Given the nature of today'Krystal session and the being couple understandably upset, Krystal Nguyen declined detailed discussion of possible etiologies for the ultrasound findings at this time.     We introduced that additional screening could be performed to attempt to identify an underlying etiology for the ultrasound findings. Krystal Nguyen that she was not interested in additional screening/testing at this point to look for an underlying cause and declined detailed discussion of screening and testing today.   Given the apparently absent amniotic fluid for twin B, amniocentesis would not likely be a feasible testing option in the pregnancy, even in the case that the patient were interested in further testing. Chromosome analysis can be performed both prenatally (amniotic fluid) and postnatally (peripheral blood or cord blood).  Additionally, microarray analysis, which can also be performed pre and postnatally, would also be an option, should the couple express interest in further genetic testing.  Microarray analysis is a molecular based technique in which a test sample of DNA (fetal) is compared to a reference (normal) genome in order to determine if the test sample has any extra or missing genetic information.  Microarray analysis allows for the detection of genetic deletions and duplications that are 1797times smaller than those identified by routine chromosome analysis.  Noninvasive prenatal screening (NIPS)/cell free DNA testing would be an available screening option for certain forms of fetal aneuploidy. However, this screen has decreased sensitivity and specificity in a twin gestation. We are available to discuss these screening or  testing options further in the future, if the patient elects to obtain more information regarding these testing options.    We discussed that  the prognosis for twin B is greatly dependent on the postnatal lung function.  They were counseled that bilateral renal abnormalities and associated oligohydramnios/anhydramnios can result in significant lung hypoplasia and postnatal respiratory distress.  The couple understands that based on the ultrasound findings the postnatal prognosis for twin B is very guarded.  We then discussed the option of serial sonography, fetal echocardiogram, and a postnatal consultation with a medical geneticist, if warranted.  We discussed the option of termination of pregnancy (TOP) via selective reduction.  We discussed that TOP is a legal option in [place] until [redacted] weeks gestation.  Given the current gestational age, we discussed the risk for complications to the pregnancy is approximately 5% associated with selective reduction. The couple was undecided regarding the option of selective reduction and indicated that they needed to further consider this option. We provided the couple with a written resource, Precious Lives, Painful Choices, and discussed that other individuals have found this helpful while considering how to proceed with a pregnancy following the discovery of ultrasound anomalies.   Given the nature of the session today, a detail review of the family histories was declined by the couple. They reported no known relatives with birth defects, intellectual disability, recurrent pregnancy loss, or known genetic conditions. Consanguinity was denied. Without further information regarding the provided family history, an accurate genetic risk cannot be calculated. Further genetic counseling is warranted if more information is obtained.  Krystal Nguyen denied exposure to environmental toxins or chemical agents. She denied the use of alcohol, tobacco or street drugs. She denied  significant viral illnesses during the course of her pregnancy.   I counseled the couple regarding the above risks and available options.  The approximate face-to-face time with the genetic counselor was 30 minutes.   Chipper Oman, MS Certified Genetic Counselor 05/09/2014

## 2014-05-09 NOTE — Telephone Encounter (Signed)
Ms. Krystal Nguyen was returning my call from earlier today. She stated that she plans to continue the pregnancy and is not interested in selective reduction. She said that the father of the pregnancy is on-board with this as well, once they left and discussed her reasoning behind this. She stated that when she was here on Friday, she did not feel like talking at that time. Discussed that this was understandable, given the nature of information that was discussed. Ms. Krystal Nguyen stated that she has very strong faith in God and has seen miracles in her family before. She stated that she has been reading a lot over the weekend, particularly information related to oligohydramnios. She appears to be realistic but hopeful regarding the current situation in the pregnancy. We reviewed why anhydramnios/oligohydramnios is associated with a poor prognosis following delivery given the association with lung development. She asked if this could possibly correct itself. We discussed that this is not likely the case. Ms. Krystal Nguyen stated that her mother and she asked if this is associated with a poorer prognosis given that it was found earlier rather than later in pregnancy. Discussed that this is the case. We reviewed the plan for routine ultrasound surveillance in the pregnancy. Ms. Krystal Nguyen asked how likely the ultrasound findings in baby B would be to affect the other twin. Discussed that this is unlikely to be the case given that they have separate placentas and separate amniotic sacs.   Briefly reviewed available screening and testing options for genetic conditions. Discussed that amniocentesis is often offered in pregnancy to assess for chromosome conditions. However, amniocentesis may not technically be possible given the low/no amniotic fluid in twin B. Ms. Krystal Nguyen indicated that she would not be interested in amniocentesis anyway given the associated risk of complications. Discussed the option of cell free DNA testing  (blood draw from the patient) to assess for three chromosome conditions. Reviewed that this screening is limited in a twin pregnancy and that there are many chromosome and genetic conditions this blood test would not assess for. Also discussed option of postnatal exam by medical geneticist and postnatal genetic testing, if warranted. Ms. Krystal Nguyen planned to think further about these screening options. I offered to discuss this in more detail at a return visit, if desired.   Ms. Krystal Nguyen was encouraged to call with additional questions or concerns.   Clydie BraunKaren Dossie Swor 05/09/2014 4:32 PM

## 2014-05-18 ENCOUNTER — Other Ambulatory Visit (HOSPITAL_COMMUNITY): Payer: Self-pay | Admitting: Family Medicine

## 2014-05-18 ENCOUNTER — Ambulatory Visit (HOSPITAL_COMMUNITY)
Admission: RE | Admit: 2014-05-18 | Discharge: 2014-05-18 | Disposition: A | Discharge status: Home / Self Care | Payer: BC Managed Care – PPO | Source: Ambulatory Visit | Attending: Family Medicine | Admitting: Family Medicine

## 2014-05-18 DIAGNOSIS — Z3689 Encounter for other specified antenatal screening: Secondary | ICD-10-CM

## 2014-05-18 DIAGNOSIS — O283 Abnormal ultrasonic finding on antenatal screening of mother: Secondary | ICD-10-CM | POA: Insufficient documentation

## 2014-05-18 DIAGNOSIS — Z36 Encounter for antenatal screening of mother: Secondary | ICD-10-CM | POA: Insufficient documentation

## 2014-05-18 DIAGNOSIS — IMO0001 Reserved for inherently not codable concepts without codable children: Secondary | ICD-10-CM

## 2014-05-18 DIAGNOSIS — Z3A00 Weeks of gestation of pregnancy not specified: Secondary | ICD-10-CM | POA: Diagnosis not present

## 2014-05-18 DIAGNOSIS — O289 Unspecified abnormal findings on antenatal screening of mother: Secondary | ICD-10-CM | POA: Insufficient documentation

## 2014-05-18 DIAGNOSIS — O30009 Twin pregnancy, unspecified number of placenta and unspecified number of amniotic sacs, unspecified trimester: Secondary | ICD-10-CM | POA: Insufficient documentation

## 2014-05-25 ENCOUNTER — Other Ambulatory Visit (HOSPITAL_COMMUNITY): Payer: Self-pay | Admitting: Family Medicine

## 2014-05-25 ENCOUNTER — Ambulatory Visit (HOSPITAL_COMMUNITY)
Admission: RE | Admit: 2014-05-25 | Discharge: 2014-05-25 | Disposition: A | Discharge status: Home / Self Care | Payer: BC Managed Care – PPO | Source: Ambulatory Visit | Attending: Family Medicine | Admitting: Family Medicine

## 2014-05-25 DIAGNOSIS — IMO0001 Reserved for inherently not codable concepts without codable children: Secondary | ICD-10-CM

## 2014-05-25 DIAGNOSIS — O283 Abnormal ultrasonic finding on antenatal screening of mother: Secondary | ICD-10-CM

## 2014-05-25 DIAGNOSIS — Z3689 Encounter for other specified antenatal screening: Secondary | ICD-10-CM

## 2014-05-25 DIAGNOSIS — O30002 Twin pregnancy, unspecified number of placenta and unspecified number of amniotic sacs, second trimester: Secondary | ICD-10-CM | POA: Diagnosis not present

## 2014-05-25 DIAGNOSIS — Z36 Encounter for antenatal screening of mother: Secondary | ICD-10-CM | POA: Insufficient documentation

## 2014-05-25 DIAGNOSIS — Z3A22 22 weeks gestation of pregnancy: Secondary | ICD-10-CM | POA: Insufficient documentation

## 2014-05-25 DIAGNOSIS — O289 Unspecified abnormal findings on antenatal screening of mother: Secondary | ICD-10-CM | POA: Diagnosis present

## 2014-05-27 DIAGNOSIS — O359XX0 Maternal care for (suspected) fetal abnormality and damage, unspecified, not applicable or unspecified: Secondary | ICD-10-CM | POA: Insufficient documentation

## 2014-05-27 DIAGNOSIS — O358XX0 Maternal care for other (suspected) fetal abnormality and damage, not applicable or unspecified: Secondary | ICD-10-CM | POA: Insufficient documentation

## 2014-05-27 DIAGNOSIS — O30042 Twin pregnancy, dichorionic/diamniotic, second trimester: Secondary | ICD-10-CM | POA: Insufficient documentation

## 2014-05-27 DIAGNOSIS — Z3A22 22 weeks gestation of pregnancy: Secondary | ICD-10-CM | POA: Insufficient documentation

## 2014-05-27 DIAGNOSIS — O26879 Cervical shortening, unspecified trimester: Secondary | ICD-10-CM | POA: Insufficient documentation

## 2014-06-09 ENCOUNTER — Encounter (HOSPITAL_COMMUNITY): Payer: Self-pay

## 2014-06-09 ENCOUNTER — Other Ambulatory Visit (HOSPITAL_COMMUNITY): Payer: Self-pay | Admitting: Family Medicine

## 2014-06-09 ENCOUNTER — Ambulatory Visit (HOSPITAL_COMMUNITY)
Admission: RE | Admit: 2014-06-09 | Discharge: 2014-06-09 | Disposition: A | Discharge status: Home / Self Care | Payer: BC Managed Care – PPO | Source: Ambulatory Visit | Attending: Family Medicine | Admitting: Family Medicine

## 2014-06-09 VITALS — BP 114/70 | HR 91 | Wt 252.8 lb

## 2014-06-09 DIAGNOSIS — O30042 Twin pregnancy, dichorionic/diamniotic, second trimester: Secondary | ICD-10-CM | POA: Diagnosis not present

## 2014-06-09 DIAGNOSIS — Z3A24 24 weeks gestation of pregnancy: Secondary | ICD-10-CM | POA: Insufficient documentation

## 2014-06-09 DIAGNOSIS — O283 Abnormal ultrasonic finding on antenatal screening of mother: Secondary | ICD-10-CM

## 2014-06-09 DIAGNOSIS — Z3689 Encounter for other specified antenatal screening: Secondary | ICD-10-CM

## 2014-06-09 DIAGNOSIS — O358XX2 Maternal care for other (suspected) fetal abnormality and damage, fetus 2: Secondary | ICD-10-CM | POA: Diagnosis not present

## 2014-06-09 DIAGNOSIS — IMO0001 Reserved for inherently not codable concepts without codable children: Secondary | ICD-10-CM

## 2014-06-09 DIAGNOSIS — O4102X2 Oligohydramnios, second trimester, fetus 2: Secondary | ICD-10-CM | POA: Diagnosis present

## 2014-06-09 DIAGNOSIS — O359XX2 Maternal care for (suspected) fetal abnormality and damage, unspecified, fetus 2: Secondary | ICD-10-CM

## 2014-06-10 ENCOUNTER — Other Ambulatory Visit (HOSPITAL_COMMUNITY): Payer: Self-pay

## 2014-06-10 DIAGNOSIS — O26872 Cervical shortening, second trimester: Secondary | ICD-10-CM | POA: Insufficient documentation

## 2014-06-11 DIAGNOSIS — Z3A24 24 weeks gestation of pregnancy: Secondary | ICD-10-CM | POA: Insufficient documentation

## 2014-06-20 ENCOUNTER — Ambulatory Visit (INDEPENDENT_AMBULATORY_CARE_PROVIDER_SITE_OTHER): Payer: BC Managed Care – PPO | Admitting: Family Medicine

## 2014-06-20 ENCOUNTER — Encounter: Payer: Self-pay | Admitting: Family Medicine

## 2014-06-20 VITALS — BP 125/67 | HR 101 | Temp 98.8°F | Wt 250.4 lb

## 2014-06-20 DIAGNOSIS — O30042 Twin pregnancy, dichorionic/diamniotic, second trimester: Secondary | ICD-10-CM

## 2014-06-20 DIAGNOSIS — B373 Candidiasis of vulva and vagina: Secondary | ICD-10-CM

## 2014-06-20 DIAGNOSIS — O98812 Other maternal infectious and parasitic diseases complicating pregnancy, second trimester: Secondary | ICD-10-CM

## 2014-06-20 DIAGNOSIS — B3731 Acute candidiasis of vulva and vagina: Secondary | ICD-10-CM

## 2014-06-20 LAB — POCT URINALYSIS DIP (DEVICE)
BILIRUBIN URINE: NEGATIVE
GLUCOSE, UA: NEGATIVE mg/dL
Hgb urine dipstick: NEGATIVE
KETONES UR: NEGATIVE mg/dL
Nitrite: NEGATIVE
PH: 6.5 (ref 5.0–8.0)
Protein, ur: NEGATIVE mg/dL
Specific Gravity, Urine: 1.015 (ref 1.005–1.030)
Urobilinogen, UA: 0.2 mg/dL (ref 0.0–1.0)

## 2014-06-20 MED ORDER — TERCONAZOLE 0.4 % VA CREA: 1.0000 | TOPICAL_CREAM | Freq: Every day | VAGINAL | Status: DC

## 2014-06-20 NOTE — Progress Notes (Signed)
Patient and partner came to MFM after OB visit today to further discuss option of cell free DNA testing (NIPS) with me. Patient indicated that when we initially discussed this testing option, which was the date the abnormal ultrasound findings were initially discovered, she misunderstood and thought the only option for genetic testing was amniocentesis, which she does not want. She stated that she would be interested in a blood test/noninvasive option. We reviewed the risks, benefits, and limitations of NIPS. Specifically, we discussed that in a twin gestation, NIPS ([facility] performed through [facility]) would assess for three trisomy conditions: trisomy 4721, trisomy 718, and trisomy 7413. We discussed that it would not assess for all chromosome conditions and would not assess for single gene conditions. We reviewed that the detection rate is lowered in a twin gestation compared to a singleton, but that the detection rate is still higher than other maternal serum screening options for fetal aneuploidy. They were counseled regarding the expense of NIPS, and that the full amount for the test is billed to the insurance provider.  The amount she will be responsible for, out of pocket, according to recent information from [facility] is at most approximately $130, but that there is a discount if paid promptly after receiving a bill. Ms. 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 She indicated that the financial part of the test would be a consideration at this time.   We reviewed that in the event that Ms. Krystal Nguyen pursued NIPS and that a high risk result was determined, the lab does not distinguish to provide an individualized result for each twin, but rather an overall assessment for the pregnancy. However, in the case of abnormal ultrasound  findings and an abnormal NIPS, we discussed that the abnormal result would typically be suspected to be resulting from with the twin with the abnormal ultrasound findings. We discussed that in the event that a high risk for a particular trisomy was found from NIPS, then this could aid in discussion regarding pregnancy management and plans for delivery. In the case that the results were within normal limits, then it would not rule out an underlying genetic or chromosome cause for the ultrasound findings but would make the chance for trisomy 421, trisomy 2318, or trisomy 2813 as the etiology to be much less likely. Ms. Krystal Nguyen asked if genetic testing could also be performed postnatally for the baby. We reviewed that a genetics evaluation would likely be available and that chromosome analysis and possibly single gene testing, depending upon the physical evaluation, could be performed postnatally.   Towards the end of the discussion, Ms. Krystal Nguyen stated that as she was thinking more about the option of cell free DNA testing, she was concerned that this testing may cause her additional anxiety due to waiting for the result to come back and/or in the case of a positive result in the pregnancy. She stated that at each appointment, she has increased anxiety about what is going on in the pregnancy and that she would not want additional genetic testing at this time to potentially provoke more anxiety for her. She stated that for this reason and for the potential financial reason, she does not want to pursue NIPS today. We reviewed that there is no time limit to cell free DNA testing/NIPS and can be performed  later in the pregnancy if she changes her mind. Ms. Krystal Nguyen and her partner were encouraged to contact us with additional questions.   Quinn PlowmanKaren Prather Failla, MS Certified Genetic Counselor 06/20/2014 12:11 PM

## 2014-06-20 NOTE — Progress Notes (Signed)
Nutrition note: 1st visit consult Pt is pregnant with twins. Pt has gained 15.4# @ 4279w5d, which is wnl. Pt reports eating 5-6x/d. Pt reports having some nausea & heartburn. Pt is taking a PNV. Pt received verbal & written education on nutrition during pregnancy with twins. Discussed tips to decrease nausea & heartburn. Discussed wt gain goals of 25-42# or 1#/wk. Pt agrees to include a protein source with each meal & snack & continue taking her PNV. Pt does not have WIC but plans to apply. Pt plans to BF. F/u in 4-6 wks Blondell RevealLaura Wiletta Bermingham, MS, RD, LDN, [facility]

## 2014-06-20 NOTE — Progress Notes (Signed)
Here for first visit with us. States feels baby A move more than B. States feels like Baby A not moving as much as it was since 06/18/14- but still moving good. States continues to have pain  between her legs. Given new patient information.

## 2014-06-20 NOTE — Patient Instructions (Signed)
Second Trimester of Pregnancy The second trimester is from week 13 through week 28, months 4 through 6. The second trimester is often a time when you feel your best. Your body has also adjusted to being pregnant, and you begin to feel better physically. Usually, morning sickness has lessened or quit completely, you may have more energy, and you may have an increase in appetite. The second trimester is also a time when the fetus is growing rapidly. At the end of the sixth month, the fetus is about 9 inches long and weighs about 1 pounds. You will likely begin to feel the baby move (quickening) between 18 and 20 weeks of the pregnancy. BODY CHANGES Your body goes through many changes during pregnancy. The changes vary from woman to woman.   Your weight will continue to increase. You will notice your lower abdomen bulging out.  You may begin to get stretch marks on your hips, abdomen, and breasts.  You may develop headaches that can be relieved by medicines approved by your health care provider.  You may urinate more often because the fetus is pressing on your bladder.  You may develop or continue to have heartburn as a result of your pregnancy.  You may develop constipation because certain hormones are causing the muscles that push waste through your intestines to slow down.  You may develop hemorrhoids or swollen, bulging veins (varicose veins).  You may have back pain because of the weight gain and pregnancy hormones relaxing your joints between the bones in your pelvis and as a result of a shift in weight and the muscles that support your balance.  Your breasts will continue to grow and be tender.  Your gums may bleed and may be sensitive to brushing and flossing.  Dark spots or blotches (chloasma, mask of pregnancy) may develop on your face. This will likely fade after the baby is born.  A dark line from your belly button to the pubic area (linea nigra) may appear. This will likely  fade after the baby is born.  You may have changes in your hair. These can include thickening of your hair, rapid growth, and changes in texture. Some women also have hair loss during or after pregnancy, or hair that feels dry or thin. Your hair will most likely return to normal after your baby is born. WHAT TO EXPECT AT YOUR PRENATAL VISITS During a routine prenatal visit:  You will be weighed to make sure you and the fetus are growing normally.  Your blood pressure will be taken.  Your abdomen will be measured to track your baby's growth.  The fetal heartbeat will be listened to.  Any test results from the previous visit will be discussed. Your health care provider may ask you:  How you are feeling.  If you are feeling the baby move.  If you have had any abnormal symptoms, such as leaking fluid, bleeding, severe headaches, or abdominal cramping.  If you have any questions. Other tests that may be performed during your second trimester include:  Blood tests that check for:  Low iron levels (anemia).  Gestational diabetes (between 24 and 28 weeks).  Rh antibodies.  Urine tests to check for infections, diabetes, or protein in the urine.  An ultrasound to confirm the proper growth and development of the baby.  An amniocentesis to check for possible genetic problems.  Fetal screens for spina bifida and Down syndrome. HOME CARE INSTRUCTIONS   Avoid all smoking, herbs, alcohol, and unprescribed   drugs. These chemicals affect the formation and growth of the baby.  Follow your health care provider's instructions regarding medicine use. There are medicines that are either safe or unsafe to take during pregnancy.  Exercise only as directed by your health care provider. Experiencing uterine cramps is a good sign to stop exercising.  Continue to eat regular, healthy meals.  Wear a good support bra for breast tenderness.  Do not use hot tubs, steam rooms, or saunas.  Wear  your seat belt at all times when driving.  Avoid raw meat, uncooked cheese, cat litter boxes, and soil used by cats. These carry germs that can cause birth defects in the baby.  Take your prenatal vitamins.  Try taking a stool softener (if your health care provider approves) if you develop constipation. Eat more high-fiber foods, such as fresh vegetables or fruit and whole grains. Drink plenty of fluids to keep your urine clear or pale yellow.  Take warm sitz baths to soothe any pain or discomfort caused by hemorrhoids. Use hemorrhoid cream if your health care provider approves.  If you develop varicose veins, wear support hose. Elevate your feet for 15 minutes, 3-4 times a day. Limit salt in your diet.  Avoid heavy lifting, wear low heel shoes, and practice good posture.  Rest with your legs elevated if you have leg cramps or low back pain.  Visit your dentist if you have not gone yet during your pregnancy. Use a soft toothbrush to brush your teeth and be gentle when you floss.  A sexual relationship may be continued unless your health care provider directs you otherwise.  Continue to go to all your prenatal visits as directed by your health care provider. SEEK MEDICAL CARE IF:   You have dizziness.  You have mild pelvic cramps, pelvic pressure, or nagging pain in the abdominal area.  You have persistent nausea, vomiting, or diarrhea.  You have a bad smelling vaginal discharge.  You have pain with urination. SEEK IMMEDIATE MEDICAL CARE IF:   You have a fever.  You are leaking fluid from your vagina.  You have spotting or bleeding from your vagina.  You have severe abdominal cramping or pain.  You have rapid weight gain or loss.  You have shortness of breath with chest pain.  You notice sudden or extreme swelling of your face, hands, ankles, feet, or legs.  You have not felt your baby move in over an hour.  You have severe headaches that do not go away with  medicine.  You have vision changes. Document Released: 06/04/2001 Document Revised: 06/15/2013 Document Reviewed: 08/11/2012 [employer] Patient Information 2015 [employer]. This information is not intended to replace advice given to you by your health care provider. Make sure you discuss any questions you have with your health care provider.  Breastfeeding Deciding to breastfeed is one of the best choices you can make for you and your baby. A change in hormones during pregnancy causes your breast tissue to grow and increases the number and size of your milk ducts. These hormones also allow proteins, sugars, and fats from your blood supply to make breast milk in your milk-producing glands. Hormones prevent breast milk from being released before your baby is born as well as prompt milk flow after birth. Once breastfeeding has begun, thoughts of your baby, as well as his or her sucking or crying, can stimulate the release of milk from your milk-producing glands.  BENEFITS OF BREASTFEEDING For Your Baby  Your first   milk (colostrum) helps your baby's digestive system function better.   There are antibodies in your milk that help your baby fight off infections.   Your baby has a lower incidence of asthma, allergies, and sudden infant death syndrome.   The nutrients in breast milk are better for your baby than infant formulas and are designed uniquely for your baby's needs.   Breast milk improves your baby's brain development.   Your baby is less likely to develop other conditions, such as childhood obesity, asthma, or type 2 diabetes mellitus.  For You   Breastfeeding helps to create a very special bond between you and your baby.   Breastfeeding is convenient. Breast milk is always available at the correct temperature and costs nothing.   Breastfeeding helps to burn calories and helps you lose the weight gained during pregnancy.   Breastfeeding makes your uterus contract to its  prepregnancy size faster and slows bleeding (lochia) after you give birth.   Breastfeeding helps to lower your risk of developing type 2 diabetes mellitus, osteoporosis, and breast or ovarian cancer later in life. SIGNS THAT YOUR BABY IS HUNGRY Early Signs of Hunger  Increased alertness or activity.  Stretching.  Movement of the head from side to side.  Movement of the head and opening of the mouth when the corner of the mouth or cheek is stroked (rooting).  Increased sucking sounds, smacking lips, cooing, sighing, or squeaking.  Hand-to-mouth movements.  Increased sucking of fingers or hands. Late Signs of Hunger  Fussing.  Intermittent crying. Extreme Signs of Hunger Signs of extreme hunger will require calming and consoling before your baby will be able to breastfeed successfully. Do not wait for the following signs of extreme hunger to occur before you initiate breastfeeding:   Restlessness.  A loud, strong cry.   Screaming. BREASTFEEDING BASICS Breastfeeding Initiation  Find a comfortable place to sit or lie down, with your neck and back well supported.  Place a pillow or rolled up blanket under your baby to bring him or her to the level of your breast (if you are seated). Nursing pillows are specially designed to help support your arms and your baby while you breastfeed.  Make sure that your baby's abdomen is facing your abdomen.   Gently massage your breast. With your fingertips, massage from your chest wall toward your nipple in a circular motion. This encourages milk flow. You may need to continue this action during the feeding if your milk flows slowly.  Support your breast with 4 fingers underneath and your thumb above your nipple. Make sure your fingers are well away from your nipple and your baby's mouth.   Stroke your baby's lips gently with your finger or nipple.   When your baby's mouth is open wide enough, quickly bring your baby to your breast,  placing your entire nipple and as much of the colored area around your nipple (areola) as possible into your baby's mouth.   More areola should be visible above your baby's upper lip than below the lower lip.   Your baby's tongue should be between his or her lower gum and your breast.   Ensure that your baby's mouth is correctly positioned around your nipple (latched). Your baby's lips should create a seal on your breast and be turned out (everted).  It is common for your baby to suck about 2-3 minutes in order to start the flow of breast milk. Latching Teaching your baby how to latch on to your breast   properly is very important. An improper latch can cause nipple pain and decreased milk supply for you and poor weight gain in your baby. Also, if your baby is not latched onto your nipple properly, he or she may swallow some air during feeding. This can make your baby fussy. Burping your baby when you switch breasts during the feeding can help to get rid of the air. However, teaching your baby to latch on properly is still the best way to prevent fussiness from swallowing air while breastfeeding. Signs that your baby has successfully latched on to your nipple:    Silent tugging or silent sucking, without causing you pain.   Swallowing heard between every 3-4 sucks.    Muscle movement above and in front of his or her ears while sucking.  Signs that your baby has not successfully latched on to nipple:   Sucking sounds or smacking sounds from your baby while breastfeeding.  Nipple pain. If you think your baby has not latched on correctly, slip your finger into the corner of your baby's mouth to break the suction and place it between your baby's gums. Attempt breastfeeding initiation again. Signs of Successful Breastfeeding Signs from your baby:   A gradual decrease in the number of sucks or complete cessation of sucking.   Falling asleep.   Relaxation of his or her body.    Retention of a small amount of milk in his or her mouth.   Letting go of your breast by himself or herself. Signs from you:  Breasts that have increased in firmness, weight, and size 1-3 hours after feeding.   Breasts that are softer immediately after breastfeeding.  Increased milk volume, as well as a change in milk consistency and color by the fifth day of breastfeeding.   Nipples that are not sore, cracked, or bleeding. Signs That Your Baby is Getting Enough Milk  Wetting at least 3 diapers in a 24-hour period. The urine should be clear and pale yellow by age 5 days.  At least 3 stools in a 24-hour period by age 5 days. The stool should be soft and yellow.  At least 3 stools in a 24-hour period by age 7 days. The stool should be seedy and yellow.  No loss of weight greater than 10% of birth weight during the first 3 days of age.  Average weight gain of 4-7 ounces (113-198 g) per week after age 4 days.  Consistent daily weight gain by age 5 days, without weight loss after the age of 2 weeks. After a feeding, your baby may spit up a small amount. This is common. BREASTFEEDING FREQUENCY AND DURATION Frequent feeding will help you make more milk and can prevent sore nipples and breast engorgement. Breastfeed when you feel the need to reduce the fullness of your breasts or when your baby shows signs of hunger. This is called "breastfeeding on demand." Avoid introducing a pacifier to your baby while you are working to establish breastfeeding (the first 4-6 weeks after your baby is born). After this time you may choose to use a pacifier. Research has shown that pacifier use during the first year of a baby's life decreases the risk of sudden infant death syndrome (SIDS). Allow your baby to feed on each breast as long as he or she wants. Breastfeed until your baby is finished feeding. When your baby unlatches or falls asleep while feeding from the first breast, offer the second breast.  Because newborns are often sleepy in the   first few weeks of life, you may need to awaken your baby to get him or her to feed. Breastfeeding times will vary from baby to baby. However, the following rules can serve as a guide to help you ensure that your baby is properly fed:  Newborns (babies 4 weeks of age or younger) may breastfeed every 1-3 hours.  Newborns should not go longer than 3 hours during the day or 5 hours during the night without breastfeeding.  You should breastfeed your baby a minimum of 8 times in a 24-hour period until you begin to introduce solid foods to your baby at around 6 months of age. BREAST MILK PUMPING Pumping and storing breast milk allows you to ensure that your baby is exclusively fed your breast milk, even at times when you are unable to breastfeed. This is especially important if you are going back to work while you are still breastfeeding or when you are not able to be present during feedings. Your lactation consultant can give you guidelines on how long it is safe to store breast milk.  A breast pump is a machine that allows you to pump milk from your breast into a sterile bottle. The pumped breast milk can then be stored in a refrigerator or freezer. Some breast pumps are operated by hand, while others use electricity. Ask your lactation consultant which type will work best for you. Breast pumps can be purchased, but some hospitals and breastfeeding support groups lease breast pumps on a monthly basis. A lactation consultant can teach you how to hand express breast milk, if you prefer not to use a pump.  CARING FOR YOUR BREASTS WHILE YOU BREASTFEED Nipples can become dry, cracked, and sore while breastfeeding. The following recommendations can help keep your breasts moisturized and healthy:  Avoid using soap on your nipples.   Wear a supportive bra. Although not required, special nursing bras and tank tops are designed to allow access to your breasts for  breastfeeding without taking off your entire bra or top. Avoid wearing underwire-style bras or extremely tight bras.  Air dry your nipples for 3-4minutes after each feeding.   Use only cotton bra pads to absorb leaked breast milk. Leaking of breast milk between feedings is normal.   Use lanolin on your nipples after breastfeeding. Lanolin helps to maintain your skin's normal moisture barrier. If you use pure lanolin, you do not need to wash it off before feeding your baby again. Pure lanolin is not toxic to your baby. You may also hand express a few drops of breast milk and gently massage that milk into your nipples and allow the milk to air dry. In the first few weeks after giving birth, some women experience extremely full breasts (engorgement). Engorgement can make your breasts feel heavy, warm, and tender to the touch. Engorgement peaks within 3-5 days after you give birth. The following recommendations can help ease engorgement:  Completely empty your breasts while breastfeeding or pumping. You may want to start by applying warm, moist heat (in the shower or with warm water-soaked hand towels) just before feeding or pumping. This increases circulation and helps the milk flow. If your baby does not completely empty your breasts while breastfeeding, pump any extra milk after he or she is finished.  Wear a snug bra (nursing or regular) or tank top for 1-2 days to signal your body to slightly decrease milk production.  Apply ice packs to your breasts, unless this is too uncomfortable for you.    Make sure that your baby is latched on and positioned properly while breastfeeding. If engorgement persists after 48 hours of following these recommendations, contact your health care provider or a lactation consultant. OVERALL HEALTH CARE RECOMMENDATIONS WHILE BREASTFEEDING  Eat healthy foods. Alternate between meals and snacks, eating 3 of each per day. Because what you eat affects your breast milk,  some of the foods may make your baby more irritable than usual. Avoid eating these foods if you are sure that they are negatively affecting your baby.  Drink milk, fruit juice, and water to satisfy your thirst (about 10 glasses a day).   Rest often, relax, and continue to take your prenatal vitamins to prevent fatigue, stress, and anemia.  Continue breast self-awareness checks.  Avoid chewing and smoking tobacco.  Avoid alcohol and drug use. Some medicines that may be harmful to your baby can pass through breast milk. It is important to ask your health care provider before taking any medicine, including all over-the-counter and prescription medicine as well as vitamin and herbal supplements. It is possible to become pregnant while breastfeeding. If birth control is desired, ask your health care provider about options that will be safe for your baby. SEEK MEDICAL CARE IF:   You feel like you want to stop breastfeeding or have become frustrated with breastfeeding.  You have painful breasts or nipples.  Your nipples are cracked or bleeding.  Your breasts are red, tender, or warm.  You have a swollen area on either breast.  You have a fever or chills.  You have nausea or vomiting.  You have drainage other than breast milk from your nipples.  Your breasts do not become full before feedings by the fifth day after you give birth.  You feel sad and depressed.  Your baby is too sleepy to eat well.  Your baby is having trouble sleeping.   Your baby is wetting less than 3 diapers in a 24-hour period.  Your baby has less than 3 stools in a 24-hour period.  Your baby's skin or the white part of his or her eyes becomes yellow.   Your baby is not gaining weight by 5 days of age. SEEK IMMEDIATE MEDICAL CARE IF:   Your baby is overly tired (lethargic) and does not want to wake up and feed.  Your baby develops an unexplained fever. Document Released: 06/10/2005 Document Revised:  06/15/2013 Document Reviewed: 12/02/2012 [employer] Patient Information 2015 [employer]. This information is not intended to replace advice given to you by your health care provider. Make sure you discuss any questions you have with your health care provider.  

## 2014-06-20 NOTE — Progress Notes (Signed)
ROI obtained, records to be requested from [facility].

## 2014-06-20 NOTE — Progress Notes (Signed)
New OB transfer from [facility] Has twin pregnancy with issues in second twin For q 4 wk growth--no formal genetic testing. C/o vaginal/vulvar itching--trial of Terazol Has now decided for blood testing of chromosomal anomalies--will f/u with MFM today for screening.

## 2014-06-23 ENCOUNTER — Encounter: Payer: Self-pay | Admitting: *Deleted

## 2014-06-28 ENCOUNTER — Ambulatory Visit (INDEPENDENT_AMBULATORY_CARE_PROVIDER_SITE_OTHER): Payer: BC Managed Care – PPO | Admitting: Obstetrics & Gynecology

## 2014-06-28 VITALS — BP 116/53 | HR 95 | Wt 254.8 lb

## 2014-06-28 DIAGNOSIS — Z23 Encounter for immunization: Secondary | ICD-10-CM

## 2014-06-28 DIAGNOSIS — O30042 Twin pregnancy, dichorionic/diamniotic, second trimester: Secondary | ICD-10-CM

## 2014-06-28 DIAGNOSIS — Z3492 Encounter for supervision of normal pregnancy, unspecified, second trimester: Secondary | ICD-10-CM

## 2014-06-28 LAB — POCT URINALYSIS DIP (DEVICE)
Bilirubin Urine: NEGATIVE
Glucose, UA: NEGATIVE mg/dL
HGB URINE DIPSTICK: NEGATIVE
Ketones, ur: NEGATIVE mg/dL
LEUKOCYTES UA: NEGATIVE
Nitrite: NEGATIVE
PH: 6.5 (ref 5.0–8.0)
Protein, ur: 30 mg/dL — AB
Specific Gravity, Urine: 1.025 (ref 1.005–1.030)
UROBILINOGEN UA: 0.2 mg/dL (ref 0.0–1.0)

## 2014-06-28 MED ORDER — TETANUS-DIPHTH-ACELL PERTUSSIS 5-2.5-18.5 LF-MCG/0.5 IM SUSP
0.5000 mL | Freq: Once | INTRAMUSCULAR | Status: AC
  Administered 2014-06-28: 0.5 mL via INTRAMUSCULAR

## 2014-06-28 NOTE — Progress Notes (Signed)
Patient reports increased vaginal d/c slight yellow tinge. Also noticed scant amount of brown d/c recently, denies odor or itch

## 2014-06-29 LAB — CBC
HCT: 29.1 % — ABNORMAL LOW (ref 36.0–46.0)
Hemoglobin: 9.7 g/dL — ABNORMAL LOW (ref 12.0–15.0)
MCH: 32.3 pg (ref 26.0–34.0)
MCHC: 33.3 g/dL (ref 30.0–36.0)
MCV: 97 fL (ref 78.0–100.0)
MPV: 10.2 fL (ref 8.6–12.4)
Platelets: 270 10*3/uL (ref 150–400)
RBC: 3 MIL/uL — ABNORMAL LOW (ref 3.87–5.11)
RDW: 13 % (ref 11.5–15.5)
WBC: 10.6 10*3/uL — AB (ref 4.0–10.5)

## 2014-06-29 LAB — HIV ANTIBODY (ROUTINE TESTING W REFLEX): HIV: NONREACTIVE

## 2014-06-29 LAB — RPR

## 2014-06-29 LAB — GLUCOSE TOLERANCE, 1 HOUR (50G) W/O FASTING: Glucose, 1 Hour GTT: 161 mg/dL — ABNORMAL HIGH (ref 70–140)

## 2014-07-01 ENCOUNTER — Telehealth: Payer: Self-pay | Admitting: *Deleted

## 2014-07-01 ENCOUNTER — Telehealth: Payer: Self-pay | Admitting: Obstetrics & Gynecology

## 2014-07-01 ENCOUNTER — Encounter: Payer: Self-pay | Admitting: Obstetrics & Gynecology

## 2014-07-01 NOTE — Telephone Encounter (Signed)
Called patient to inform her of an appointment for her 3 hr gtt. Wasn't able to leave a message, so I'm sending her a certified letter.

## 2014-07-01 NOTE — Patient Instructions (Signed)
Third Trimester of Pregnancy The third trimester is from week 29 through week 42, months 7 through 9. The third trimester is a time when the fetus is growing rapidly. At the end of the ninth month, the fetus is about 20 inches in length and weighs 6-10 pounds.  BODY CHANGES Your body goes through many changes during pregnancy. The changes vary from woman to woman.   Your weight will continue to increase. You can expect to gain 25-35 pounds (11-16 kg) by the end of the pregnancy.  You may begin to get stretch marks on your hips, abdomen, and breasts.  You may urinate more often because the fetus is moving lower into your pelvis and pressing on your bladder.  You may develop or continue to have heartburn as a result of your pregnancy.  You may develop constipation because certain hormones are causing the muscles that push waste through your intestines to slow down.  You may develop hemorrhoids or swollen, bulging veins (varicose veins).  You may have pelvic pain because of the weight gain and pregnancy hormones relaxing your joints between the bones in your pelvis. Backaches may result from overexertion of the muscles supporting your posture.  You may have changes in your hair. These can include thickening of your hair, rapid growth, and changes in texture. Some women also have hair loss during or after pregnancy, or hair that feels dry or thin. Your hair will most likely return to normal after your baby is born.  Your breasts will continue to grow and be tender. A yellow discharge may leak from your breasts called colostrum.  Your belly button may stick out.  You may feel short of breath because of your expanding uterus.  You may notice the fetus "dropping," or moving lower in your abdomen.  You may have a bloody mucus discharge. This usually occurs a few days to a week before labor begins.  Your cervix becomes thin and soft (effaced) near your due date. WHAT TO EXPECT AT YOUR PRENATAL  EXAMS  You will have prenatal exams every 2 weeks until week 36. Then, you will have weekly prenatal exams. During a routine prenatal visit:  You will be weighed to make sure you and the fetus are growing normally.  Your blood pressure is taken.  Your abdomen will be measured to track your baby's growth.  The fetal heartbeat will be listened to.  Any test results from the previous visit will be discussed.  You may have a cervical check near your due date to see if you have effaced. At around 36 weeks, your caregiver will check your cervix. At the same time, your caregiver will also perform a test on the secretions of the vaginal tissue. This test is to determine if a type of bacteria, Group B streptococcus, is present. Your caregiver will explain this further. Your caregiver may ask you:  What your birth plan is.  How you are feeling.  If you are feeling the baby move.  If you have had any abnormal symptoms, such as leaking fluid, bleeding, severe headaches, or abdominal cramping.  If you have any questions. Other tests or screenings that may be performed during your third trimester include:  Blood tests that check for low iron levels (anemia).  Fetal testing to check the health, activity level, and growth of the fetus. Testing is done if you have certain medical conditions or if there are problems during the pregnancy. FALSE LABOR You may feel small, irregular contractions that   eventually go away. These are called Braxton Hicks contractions, or false labor. Contractions may last for hours, days, or even weeks before true labor sets in. If contractions come at regular intervals, intensify, or become painful, it is best to be seen by your caregiver.  SIGNS OF LABOR   Menstrual-like cramps.  Contractions that are 5 minutes apart or less.  Contractions that start on the top of the uterus and spread down to the lower abdomen and back.  A sense of increased pelvic pressure or back  pain.  A watery or bloody mucus discharge that comes from the vagina. If you have any of these signs before the 37th week of pregnancy, call your caregiver right away. You need to go to the hospital to get checked immediately. HOME CARE INSTRUCTIONS   Avoid all smoking, herbs, alcohol, and unprescribed drugs. These chemicals affect the formation and growth of the baby.  Follow your caregiver's instructions regarding medicine use. There are medicines that are either safe or unsafe to take during pregnancy.  Exercise only as directed by your caregiver. Experiencing uterine cramps is a good sign to stop exercising.  Continue to eat regular, healthy meals.  Wear a good support bra for breast tenderness.  Do not use hot tubs, steam rooms, or saunas.  Wear your seat belt at all times when driving.  Avoid raw meat, uncooked cheese, cat litter boxes, and soil used by cats. These carry germs that can cause birth defects in the baby.  Take your prenatal vitamins.  Try taking a stool softener (if your caregiver approves) if you develop constipation. Eat more high-fiber foods, such as fresh vegetables or fruit and whole grains. Drink plenty of fluids to keep your urine clear or pale yellow.  Take warm sitz baths to soothe any pain or discomfort caused by hemorrhoids. Use hemorrhoid cream if your caregiver approves.  If you develop varicose veins, wear support hose. Elevate your feet for 15 minutes, 3-4 times a day. Limit salt in your diet.  Avoid heavy lifting, wear low heal shoes, and practice good posture.  Rest a lot with your legs elevated if you have leg cramps or low back pain.  Visit your dentist if you have not gone during your pregnancy. Use a soft toothbrush to brush your teeth and be gentle when you floss.  A sexual relationship may be continued unless your caregiver directs you otherwise.  Do not travel far distances unless it is absolutely necessary and only with the approval  of your caregiver.  Take prenatal classes to understand, practice, and ask questions about the labor and delivery.  Make a trial run to the hospital.  Pack your hospital bag.  Prepare the baby's nursery.  Continue to go to all your prenatal visits as directed by your caregiver. SEEK MEDICAL CARE IF:  You are unsure if you are in labor or if your water has broken.  You have dizziness.  You have mild pelvic cramps, pelvic pressure, or nagging pain in your abdominal area.  You have persistent nausea, vomiting, or diarrhea.  You have a bad smelling vaginal discharge.  You have pain with urination. SEEK IMMEDIATE MEDICAL CARE IF:   You have a fever.  You are leaking fluid from your vagina.  You have spotting or bleeding from your vagina.  You have severe abdominal cramping or pain.  You have rapid weight loss or gain.  You have shortness of breath with chest pain.  You notice sudden or extreme swelling   of your face, hands, ankles, feet, or legs.  You have not felt your baby move in over an hour.  You have severe headaches that do not go away with medicine.  You have vision changes. Document Released: 06/04/2001 Document Revised: 06/15/2013 Document Reviewed: 08/11/2012 ExitCare Patient Information 2015 [employer]. This information is not intended to replace advice given to you by your health care provider. Make sure you discuss any questions you have with your health care provider.  

## 2014-07-01 NOTE — Telephone Encounter (Signed)
-----   Message from Adam PhenixJames G Arnold, MD sent at 07/01/2014  9:16 AM EST ----- Needs 3 hr GTT

## 2014-07-01 NOTE — Telephone Encounter (Signed)
Contacted patient, glucose results given. Pt can come 07/05/14 for 3 hour glucose, instructions given. Pt verbalizes understanding.

## 2014-07-01 NOTE — Progress Notes (Signed)
1 hr GTT today

## 2014-07-05 ENCOUNTER — Other Ambulatory Visit: Payer: BC Managed Care – PPO

## 2014-07-05 DIAGNOSIS — R7309 Other abnormal glucose: Secondary | ICD-10-CM

## 2014-07-06 ENCOUNTER — Ambulatory Visit (HOSPITAL_COMMUNITY)
Admission: RE | Admit: 2014-07-06 | Discharge: 2014-07-06 | Disposition: A | Discharge status: Home / Self Care | Payer: BC Managed Care – PPO | Source: Ambulatory Visit | Attending: Family Medicine | Admitting: Family Medicine

## 2014-07-06 ENCOUNTER — Encounter (HOSPITAL_COMMUNITY): Payer: Self-pay

## 2014-07-06 DIAGNOSIS — O4103X2 Oligohydramnios, third trimester, fetus 2: Secondary | ICD-10-CM | POA: Insufficient documentation

## 2014-07-06 DIAGNOSIS — O30043 Twin pregnancy, dichorionic/diamniotic, third trimester: Secondary | ICD-10-CM | POA: Diagnosis not present

## 2014-07-06 DIAGNOSIS — IMO0001 Reserved for inherently not codable concepts without codable children: Secondary | ICD-10-CM

## 2014-07-06 DIAGNOSIS — Z3A28 28 weeks gestation of pregnancy: Secondary | ICD-10-CM | POA: Diagnosis not present

## 2014-07-06 DIAGNOSIS — O283 Abnormal ultrasonic finding on antenatal screening of mother: Secondary | ICD-10-CM

## 2014-07-06 DIAGNOSIS — Z3689 Encounter for other specified antenatal screening: Secondary | ICD-10-CM

## 2014-07-06 DIAGNOSIS — O358XX2 Maternal care for other (suspected) fetal abnormality and damage, fetus 2: Secondary | ICD-10-CM | POA: Diagnosis not present

## 2014-07-06 DIAGNOSIS — O4103X0 Oligohydramnios, third trimester, not applicable or unspecified: Secondary | ICD-10-CM | POA: Diagnosis present

## 2014-07-06 LAB — GLUCOSE TOLERANCE, 3 HOURS
GLUCOSE, 2 HOUR-GESTATIONAL: 168 mg/dL — AB (ref 70–164)
Glucose Tolerance, 1 hour: 183 mg/dL (ref 70–189)
Glucose Tolerance, Fasting: 89 mg/dL (ref 70–104)
Glucose, GTT - 3 Hour: 133 mg/dL (ref 70–144)

## 2014-07-07 ENCOUNTER — Telehealth: Payer: Self-pay

## 2014-07-07 NOTE — Telephone Encounter (Signed)
-----   Message from Adam PhenixJames G Arnold, MD sent at 07/07/2014  9:05 AM EST ----- Failed 3 hr [facility]

## 2014-07-07 NOTE — Telephone Encounter (Signed)
Patient returned call. Informed her of results and need for diabetic education. Patient states she can come in at 0900 07/11/14. NO questions or concerns. Message sent to admin pool to add to schedule and schedule appointment for following Monday in [facility].

## 2014-07-07 NOTE — Telephone Encounter (Signed)
Attempted to contact patient. No answer. No voicemail box set up. Unable to leave message.

## 2014-07-11 ENCOUNTER — Encounter: Payer: BC Managed Care – PPO | Attending: Obstetrics & Gynecology | Admitting: *Deleted

## 2014-07-11 ENCOUNTER — Ambulatory Visit: Payer: BC Managed Care – PPO | Admitting: Obstetrics & Gynecology

## 2014-07-11 DIAGNOSIS — O24419 Gestational diabetes mellitus in pregnancy, unspecified control: Secondary | ICD-10-CM | POA: Insufficient documentation

## 2014-07-11 DIAGNOSIS — Z713 Dietary counseling and surveillance: Secondary | ICD-10-CM | POA: Diagnosis not present

## 2014-07-11 DIAGNOSIS — O30042 Twin pregnancy, dichorionic/diamniotic, second trimester: Secondary | ICD-10-CM

## 2014-07-11 LAB — POCT URINALYSIS DIP (DEVICE)
Bilirubin Urine: NEGATIVE
GLUCOSE, UA: NEGATIVE mg/dL
HGB URINE DIPSTICK: NEGATIVE
KETONES UR: NEGATIVE mg/dL
Leukocytes, UA: NEGATIVE
NITRITE: NEGATIVE
Protein, ur: 30 mg/dL — AB
Specific Gravity, Urine: 1.025 (ref 1.005–1.030)
Urobilinogen, UA: 0.2 mg/dL (ref 0.0–1.0)
pH: 7 (ref 5.0–8.0)

## 2014-07-11 MED ORDER — GLUCOSE BLOOD VI STRP: ORAL_STRIP | Status: DC

## 2014-07-11 MED ORDER — ACCU-CHEK FASTCLIX LANCETS MISC: 1.0000 | Freq: Four times a day (QID) | Status: DC

## 2014-07-11 NOTE — Progress Notes (Signed)
  Patient was seen on 07/11/14 for Gestational Diabetes self-management . The following learning objectives were met by the patient :   States the definition of Gestational Diabetes  States why dietary management is important in controlling blood glucose  Describes the effects of carbohydrates on blood glucose levels  Demonstrates ability to create a balanced meal plan  Demonstrates carbohydrate counting   States when to check blood glucose levels  Demonstrates proper blood glucose monitoring techniques  States the effect of stress and exercise on blood glucose levels  States the importance of limiting caffeine and abstaining from alcohol and smoking  Plan:  Aim for 2 Carb Choices per meal (30 grams) +/- 1 either way for breakfast Aim for 3 Carb Choices per meal (45 grams) +/- 1 either way from lunch and dinner Aim for 1-2 Carbs per snack Begin reading food labels for Total Carbohydrate and sugar grams of foods Consider  increasing your activity level by walking daily as tolerated Begin checking BG before breakfast and 2 hours after first bit of breakfast, lunch and dinner after  as directed by MD  Take medication  as directed by MD  Blood glucose monitor given: Accu Chek Nano BG Monitoring Kit Lot # [id number] Exp: 02/22/15 Blood glucose reading: 121 3hpp  Patient instructed to monitor glucose levels: FBS: 60 - <90 2 hour: <120  Patient received the following handouts:  Nutrition Diabetes and Pregnancy  Carbohydrate Counting List  Meal Planning worksheet  Patient will be seen for follow-up as needed.

## 2014-07-12 ENCOUNTER — Encounter (HOSPITAL_COMMUNITY): Payer: Self-pay | Admitting: *Deleted

## 2014-07-12 ENCOUNTER — Other Ambulatory Visit: Payer: Self-pay | Admitting: General Practice

## 2014-07-12 ENCOUNTER — Inpatient Hospital Stay (HOSPITAL_COMMUNITY)
Admission: AD | Admit: 2014-07-12 | Discharge: 2014-07-12 | Disposition: A | Discharge status: Home / Self Care | Payer: BC Managed Care – PPO | Source: Ambulatory Visit | Attending: Obstetrics and Gynecology | Admitting: Obstetrics and Gynecology

## 2014-07-12 ENCOUNTER — Encounter: Payer: Self-pay | Admitting: General Practice

## 2014-07-12 ENCOUNTER — Encounter: Payer: BC Managed Care – PPO | Admitting: Obstetrics & Gynecology

## 2014-07-12 DIAGNOSIS — O26899 Other specified pregnancy related conditions, unspecified trimester: Secondary | ICD-10-CM

## 2014-07-12 DIAGNOSIS — R109 Unspecified abdominal pain: Secondary | ICD-10-CM | POA: Diagnosis not present

## 2014-07-12 DIAGNOSIS — O26893 Other specified pregnancy related conditions, third trimester: Secondary | ICD-10-CM

## 2014-07-12 DIAGNOSIS — R000 Tachycardia, unspecified: Secondary | ICD-10-CM

## 2014-07-12 DIAGNOSIS — R12 Heartburn: Secondary | ICD-10-CM | POA: Insufficient documentation

## 2014-07-12 DIAGNOSIS — O30003 Twin pregnancy, unspecified number of placenta and unspecified number of amniotic sacs, third trimester: Secondary | ICD-10-CM | POA: Diagnosis not present

## 2014-07-12 DIAGNOSIS — Z3A28 28 weeks gestation of pregnancy: Secondary | ICD-10-CM | POA: Diagnosis not present

## 2014-07-12 DIAGNOSIS — IMO0001 Reserved for inherently not codable concepts without codable children: Secondary | ICD-10-CM

## 2014-07-12 DIAGNOSIS — O9989 Other specified diseases and conditions complicating pregnancy, childbirth and the puerperium: Secondary | ICD-10-CM | POA: Insufficient documentation

## 2014-07-12 HISTORY — DX: Gestational diabetes mellitus in pregnancy, unspecified control: O24.419

## 2014-07-12 HISTORY — DX: Herpesviral infection of urogenital system, unspecified: A60.00

## 2014-07-12 LAB — URINE MICROSCOPIC-ADD ON

## 2014-07-12 LAB — URINALYSIS, ROUTINE W REFLEX MICROSCOPIC
BILIRUBIN URINE: NEGATIVE
Glucose, UA: NEGATIVE mg/dL
HGB URINE DIPSTICK: NEGATIVE
Ketones, ur: NEGATIVE mg/dL
Nitrite: NEGATIVE
Protein, ur: NEGATIVE mg/dL
Specific Gravity, Urine: 1.015 (ref 1.005–1.030)
Urobilinogen, UA: 0.2 mg/dL (ref 0.0–1.0)
pH: 6 (ref 5.0–8.0)

## 2014-07-12 LAB — GLUCOSE, CAPILLARY: Glucose-Capillary: 83 mg/dL (ref 70–99)

## 2014-07-12 MED ORDER — GI COCKTAIL ~~LOC~~
30.0000 mL | Freq: Once | ORAL | Status: AC
  Administered 2014-07-12: 30 mL via ORAL
  Filled 2014-07-12: qty 30

## 2014-07-12 MED ORDER — RANITIDINE HCL 150 MG PO CAPS: 150.0000 mg | ORAL_CAPSULE | Freq: Every day | ORAL | Status: DC

## 2014-07-12 NOTE — MAU Note (Signed)
Pt states feels like heart is racing now. Pulse on o2 sat monitor showing 102-106

## 2014-07-12 NOTE — MAU Note (Signed)
Heart has been beating fast off and on all day today.(no hx)  Pain up under ribs, ? From growth of baby; ongoing problem  Recently dx with diabetis, is to check blood sugar 5 times a day, only gave her supplies for 2 days, doesn't have the money for more right now.

## 2014-07-12 NOTE — Discharge Instructions (Signed)
Abdominal Pain During Pregnancy °Abdominal pain is common in pregnancy. Most of the time, it does not cause harm. There are many causes of abdominal pain. Some causes are more serious than others. Some of the causes of abdominal pain in pregnancy are easily diagnosed. Occasionally, the diagnosis takes time to understand. Other times, the cause is not determined. Abdominal pain can be a sign that something is very wrong with the pregnancy, or the pain may have nothing to do with the pregnancy at all. For this reason, always tell your health care provider if you have any abdominal discomfort. °HOME CARE INSTRUCTIONS  °Monitor your abdominal pain for any changes. The following actions may help to alleviate any discomfort you are experiencing: °· Do not have sexual intercourse or put anything in your vagina until your symptoms go away completely. °· Get plenty of rest until your pain improves. °· Drink clear fluids if you feel nauseous. Avoid solid food as long as you are uncomfortable or nauseous. °· Only take over-the-counter or prescription medicine as directed by your health care provider. °· Keep all follow-up appointments with your health care provider. °SEEK IMMEDIATE MEDICAL CARE IF: °· You are bleeding, leaking fluid, or passing tissue from the vagina. °· You have increasing pain or cramping. °· You have persistent vomiting. °· You have painful or bloody urination. °· You have a fever. °· You notice a decrease in your baby's movements. °· You have extreme weakness or feel faint. °· You have shortness of breath, with or without abdominal pain. °· You develop a severe headache with abdominal pain. °· You have abnormal vaginal discharge with abdominal pain. °· You have persistent diarrhea. °· You have abdominal pain that continues even after rest, or gets worse. °MAKE SURE YOU:  °· Understand these instructions. °· Will watch your condition. °· Will get help right away if you are not doing well or get  worse. °Document Released: 06/10/2005 Document Revised: 03/31/2013 Document Reviewed: 01/07/2013 °ExitCare® Patient Information ©2015 [employer]. This information is not intended to replace advice given to you by your health care provider. Make sure you discuss any questions you have with your health care provider. ° °Heartburn During Pregnancy  °Heartburn is a burning sensation in the chest caused by stomach acid backing up into the esophagus. Heartburn is common in pregnancy because a certain hormone (progesterone) is released when a woman is pregnant. The progesterone hormone may relax the valve that separates the esophagus from the stomach. This allows acid to go up into the esophagus, causing heartburn. Heartburn may also happen in pregnancy because the enlarging uterus pushes up on the stomach, which pushes more acid into the esophagus. This is especially true in the later stages of pregnancy. Heartburn problems usually go away after giving birth. °CAUSES  °Heartburn is caused by stomach acid backing up into the esophagus. During pregnancy, this may result from various things, including:  °· The progesterone hormone. °· Changing hormone levels. °· The growing uterus pushing stomach acid upward. °· Large meals. °· Certain foods and drinks. °· Exercise. °· Increased acid production. °SIGNS AND SYMPTOMS  °· Burning pain in the chest or lower throat. °· Bitter taste in the mouth. °· Coughing. °DIAGNOSIS  °Your health care provider will typically diagnose heartburn by taking a careful history of your concern. Blood tests may be done to check for a certain type of bacteria that is associated with heartburn. Sometimes, heartburn is diagnosed by prescribing a heartburn medicine to see if the symptoms   improve. In some cases, a procedure called an endoscopy may be done. In this procedure, a tube with a light and a camera on the end (endoscope) is used to examine the esophagus and the stomach. TREATMENT  Treatment  will vary depending on the severity of your symptoms. Your health care provider may recommend:  Over-the-counter medicines (antacids, acid reducers) for mild heartburn.  Prescription medicines to decrease stomach acid or to protect your stomach lining.  Certain changes in your diet.  Elevating the head of your bed by putting blocks under the legs. This helps prevent stomach acid from backing up into the esophagus when you are lying down. HOME CARE INSTRUCTIONS   Only take over-the-counter or prescription medicines as directed by your health care provider.  Raise the head of your bed by putting blocks under the legs if instructed to do so by your health care provider. Sleeping with more pillows is not effective because it only changes the position of your head.  Do not exercise right after eating.  Avoid eating 2-3 hours before bed. Do not lie down right after eating.  Eat small meals throughout the day instead of three large meals.  Identify foods and beverages that make your symptoms worse and avoid them. Foods you may want to avoid include:  Peppers.  Chocolate.  High-fat foods, including fried foods.  Spicy foods.  Garlic and onions.  Citrus fruits, including oranges, grapefruit, lemons, and limes.  Food containing tomatoes or tomato products.  Mint.  Carbonated and caffeinated drinks.  Vinegar. SEEK MEDICAL CARE IF:  You have abdominal pain of any kind.  You feel burning in your upper abdomen or chest, especially after eating or lying down.  You have nausea and vomiting.  Your stomach feels upset after you eat. SEEK IMMEDIATE MEDICAL CARE IF:   You have severe chest pain that goes down your arm or into your jaw or neck.  You feel sweaty, dizzy, or light-headed.  You become short of breath.  You vomit blood.  You have difficulty or pain with swallowing.  You have bloody or black, tarry stools.  You have episodes of heartburn more than 3 times a  week, for more than 2 weeks. MAKE SURE YOU:  Understand these instructions.  Will watch your condition.  Will get help right away if you are not doing well or get worse. Document Released: 06/07/2000 Document Revised: 06/15/2013 Document Reviewed: 01/27/2013 [facility] Patient Information 2015 [place]. This information is not intended to replace advice given to you by your health care provider. Make sure you discuss any questions you have with your health care provider.

## 2014-07-12 NOTE — MAU Provider Note (Signed)
Chief Complaint:  No chief complaint on file.   First Provider Initiated Contact with Patient 07/12/14 1850      HPI: Krystal Nguyen is a 24 y.o. G2P0010 at [id number] twins pregnancy pt of [facility] who presents to maternity admissions reporting sensation that her heart was racing earlier today and tenderness in her upper abdomen/rib area.  She reports some mild coughing last night, none today and denies vomiting.  She does report daily heartburn and is not currently taking any medication for this.  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  She  She reports good fetal movement, denies cramping or lower abdominal pain, LOF, vaginal bleeding, vaginal itching/burning, urinary symptoms, h/a, dizziness, n/v, or fever/chills.     Past Medical History: Past Medical History  Diagnosis Date  . Miscarriage   . Obesity   . Asthma     childhood  . Gestational diabetes   . Genital herpes     Past obstetric history: OB History  Gravida Para Term Preterm AB SAB TAB Ectopic Multiple Living  2    1 1         # Outcome Date GA Lbr Len/2nd Weight Sex Delivery Anes PTL Lv  2 Current           1 SAB 09/03/13              Past Surgical History: Past Surgical History  Procedure Laterality Date  . No past surgeries      Family History: Family History  Problem Relation Age of Onset  . Diabetes Maternal Grandmother   . Hypertension Maternal Grandmother   . Cancer Maternal Grandfather   . Diabetes Father     Social History: History  Substance Use Topics  . Smoking status: Never Smoker   . Smokeless tobacco: Never Used  . Alcohol Use: No     Comment: social    Allergies:  Allergies  Allergen Reactions  . Bactrim [Sulfamethoxazole-Trimethoprim] Hives and Itching    Meds:  Prescriptions prior to admission  Medication Sig Dispense Refill Last Dose  .  ACCU-CHEK FASTCLIX LANCETS MISC Inject 1 each into the skin 4 (four) times daily. [phone] for testing 4 times daily 102 each 12 07/12/2014 at Unknown time  . Ferrous Sulfate (SLOW FE PO) Take 1 tablet by mouth daily.   07/12/2014 at 1300  . glucose blood (ACCU-CHEK SMARTVIEW) test strip Use as instructed to test blood sugar 4 times daily 50 each 12 07/12/2014 at 0900  . OVER THE COUNTER MEDICATION Take 2 tablets by mouth daily. Prenatal Gummies.   07/12/2014 at 1400  . valACYclovir (VALTREX) 500 MG tablet Take 500 mg by mouth 2 (two) times daily. Prescription written as; take twice daily until outbreak area clear, then take once daily.  2 Past Month at Unknown time  . acetaminophen (TYLENOL) 325 MG tablet Take 325 mg by mouth daily as needed for mild pain.     Marland Kitchen. alum & mag hydroxide-simeth (MAALOX/MYLANTA) 200-200-20 MG/5ML suspension Take 15 mLs by mouth every 6 (six) hours as needed for indigestion or heartburn.   Not Taking at Unknown time  . docusate sodium (COLACE) 100 MG capsule Take 1 capsule (100 mg total) by mouth 2 (two) times daily as needed for mild constipation. (Patient not taking: Reported on 07/12/2014) 30 capsule 2 Not Taking at Unknown time  ROS: Pertinent findings in history of present illness.  Physical Exam  Blood pressure 123/66, pulse 96, temperature 99.2 F (37.3 C), temperature source Oral, resp. rate 18, weight 114.76 kg (253 lb), last menstrual period 12/22/2013, SpO2 100 %. GENERAL: Well-developed, well-nourished female in no acute distress.  HEENT: normocephalic HEART: normal rate RESP: normal effort ABDOMEN: Soft, non-tender, gravid appropriate for gestational age, fundal height appropriate for gestation with twins.  Tenderness immediately above fundus mostly in epigastric area.  EXTREMITIES: Nontender, no edema NEURO: alert and oriented    FHT Baby A:  Baseline 145 , moderate variability, accelerations present, no decelerations FHT Baby B:  Baseline 140 , moderate  variability, accelerations present, no decelerations Contractions: None on toco or to palpation   Labs: Results for orders placed or performed during the hospital encounter of 07/12/14 (from the past 24 hour(s))  Urinalysis, Routine w reflex microscopic     Status: Abnormal   Collection Time: 07/12/14  3:43 PM  Result Value Ref Range   Color, Urine YELLOW YELLOW   APPearance CLEAR CLEAR   Specific Gravity, Urine 1.015 1.005 - 1.030   pH 6.0 5.0 - 8.0   Glucose, UA NEGATIVE NEGATIVE mg/dL   Hgb urine dipstick NEGATIVE NEGATIVE   Bilirubin Urine NEGATIVE NEGATIVE   Ketones, ur NEGATIVE NEGATIVE mg/dL   Protein, ur NEGATIVE NEGATIVE mg/dL   Urobilinogen, UA 0.2 0.0 - 1.0 mg/dL   Nitrite NEGATIVE NEGATIVE   Leukocytes, UA TRACE (A) NEGATIVE  Urine microscopic-add on     Status: Abnormal   Collection Time: 07/12/14  3:43 PM  Result Value Ref Range   Squamous Epithelial / LPF FEW (A) RARE   WBC, UA 3-6 <3 WBC/hpf   RBC / HPF 0-2 <3 RBC/hpf   Bacteria, UA FEW (A) RARE   Urine-Other MUCOUS PRESENT   Glucose, capillary     Status: None   Collection Time: 07/12/14  5:12 PM  Result Value Ref Range   Glucose-Capillary 83 70 - 99 mg/dL     Assessment: 1. Heartburn during pregnancy in third trimester, antepartum   2. Twins   3. Abdominal pain affecting pregnancy, antepartum   4. Racing heart beat     Plan: GI cocktail x 1 in MAU Discharge home Zantac 150 mg BID x 7 days F/U with clinic tomorrow to address diabetic supplies Return to MAU if heart symptoms worsen/persist   Follow-up Information    Follow up with [facility].   Specialty:  Obstetrics and Gynecology   Why:  As scheduled   Contact information:   [address] [place] [address] [phone]      Follow up with [facility] [place] [facility]ERN[facility].   Why:  As needed for emergencies   Contact information:   [address] [id number] mc [place] [address] [phone]       Medication List    STOP taking these medications        docusate sodium 100 MG capsule  Commonly known as:  COLACE      TAKE these medications        ACCU-CHEK FASTCLIX LANCETS Misc  Inject 1 each into the skin 4 (four) times daily. 413.24 for testing 4 times daily     acetaminophen 325 MG tablet  Commonly known as:  TYLENOL  Take 325 mg by mouth daily as needed for mild pain.     alum & mag hydroxide-simeth 200-200-20 MG/5ML suspension  Commonly known  as:  MAALOX/MYLANTA  Take 15 mLs by mouth every 6 (six) hours as needed for indigestion or heartburn.     glucose blood test strip  Commonly known as:  ACCU-CHEK SMARTVIEW  Use as instructed to test blood sugar 4 times daily     OVER THE COUNTER MEDICATION  Take 2 tablets by mouth daily. Prenatal Gummies.     ranitidine 150 MG capsule  Commonly known as:  ZANTAC  Take 1 capsule (150 mg total) by mouth daily.     SLOW FE PO  Take 1 tablet by mouth daily.     valACYclovir 500 MG tablet  Commonly known as:  VALTREX  Take 500 mg by mouth 2 (two) times daily. Prescription written as; take twice daily until outbreak area clear, then take once daily.        Sharen Counter Certified Nurse-Midwife 07/12/2014 7:50 PM

## 2014-07-12 NOTE — MAU Note (Signed)
Pt states her upper abd has been aching intermittently for the last month, began having sharp pain today.

## 2014-07-13 ENCOUNTER — Telehealth: Payer: Self-pay | Admitting: *Deleted

## 2014-07-13 NOTE — Telephone Encounter (Signed)
I spoke with Krystal Nguyen and patient has private insurance with drug coverage that will cover her supplies. I called patient and informed her that we do not have any free supplies for patients with insurance. Patient voiced understanding and stated that she will try to get her supplies. She will talk to [place] more about it on Monday.

## 2014-07-13 NOTE — Telephone Encounter (Signed)
Patient called and stated that she can't afford her diabetes testing supplies. She was seen in MAU yesterday and was told by Misty StanleyLisa that we can help her get them for free. Would like a call back about what to do.

## 2014-07-18 ENCOUNTER — Ambulatory Visit (INDEPENDENT_AMBULATORY_CARE_PROVIDER_SITE_OTHER): Payer: BC Managed Care – PPO | Admitting: Obstetrics and Gynecology

## 2014-07-18 ENCOUNTER — Other Ambulatory Visit (HOSPITAL_COMMUNITY): Payer: Self-pay | Admitting: Maternal and Fetal Medicine

## 2014-07-18 VITALS — BP 116/77 | HR 103 | Temp 97.7°F | Wt 251.9 lb

## 2014-07-18 DIAGNOSIS — O30042 Twin pregnancy, dichorionic/diamniotic, second trimester: Secondary | ICD-10-CM

## 2014-07-18 DIAGNOSIS — O359XX2 Maternal care for (suspected) fetal abnormality and damage, unspecified, fetus 2: Secondary | ICD-10-CM

## 2014-07-18 DIAGNOSIS — O30043 Twin pregnancy, dichorionic/diamniotic, third trimester: Secondary | ICD-10-CM

## 2014-07-18 DIAGNOSIS — O4103X2 Oligohydramnios, third trimester, fetus 2: Secondary | ICD-10-CM

## 2014-07-18 DIAGNOSIS — O24419 Gestational diabetes mellitus in pregnancy, unspecified control: Secondary | ICD-10-CM

## 2014-07-18 LAB — POCT URINALYSIS DIP (DEVICE)
BILIRUBIN URINE: NEGATIVE
Glucose, UA: NEGATIVE mg/dL
Hgb urine dipstick: NEGATIVE
NITRITE: NEGATIVE
PROTEIN: 30 mg/dL — AB
SPECIFIC GRAVITY, URINE: 1.025 (ref 1.005–1.030)
UROBILINOGEN UA: 0.2 mg/dL (ref 0.0–1.0)
pH: 6.5 (ref 5.0–8.0)

## 2014-07-18 MED ORDER — GLYBURIDE 2.5 MG PO TABS: 2.5000 mg | ORAL_TABLET | Freq: Every evening | ORAL | Status: DC

## 2014-07-18 NOTE — Progress Notes (Signed)
Only complaint today is some greyish vaginal discharge with itching. Occasional BHs, spaced out, maybe hourly. None yesterday.  No LOF. + FM x 2, no VB.   #) GDM - has not been picking up diabetic supplies, will pick up on Friday given funds.  - States that she did use strips that we gave her. Fasting - 120, 99, 101 2 hr lunch - 88, 100, 101  Start glyburide 2.5 mg qPM given elevated fasting.   Will meet with neonatalogist this afternoon.  RTC 1 wk for glucose readings.

## 2014-07-18 NOTE — Patient Instructions (Signed)
Return to clinic for any obstetric concerns or go to MAU for evaluation  

## 2014-07-18 NOTE — Progress Notes (Signed)
Spoke with neonatologist Dr. Katrinka BlazingSmith [phone]) who will come down to meet with patient after morning rounds.  [facility].

## 2014-07-18 NOTE — Progress Notes (Signed)
Patient states she has not been able to pick up strips and lancets yet-- gets paid at the end of the week. Reports she did check her sugars with the supplies we gave her.  C/o of pelvic pressure and occasional contractions.  C/o of grayish white discharge causing vaginal itching.

## 2014-07-19 LAB — WET PREP, GENITAL
CLUE CELLS WET PREP: NONE SEEN
TRICH WET PREP: NONE SEEN
Yeast Wet Prep HPF POC: NONE SEEN

## 2014-07-25 ENCOUNTER — Ambulatory Visit (INDEPENDENT_AMBULATORY_CARE_PROVIDER_SITE_OTHER): Payer: BC Managed Care – PPO | Admitting: Obstetrics and Gynecology

## 2014-07-25 VITALS — BP 117/77 | HR 95 | Temp 97.6°F | Wt 253.5 lb

## 2014-07-25 DIAGNOSIS — O24419 Gestational diabetes mellitus in pregnancy, unspecified control: Secondary | ICD-10-CM

## 2014-07-25 DIAGNOSIS — O359XX0 Maternal care for (suspected) fetal abnormality and damage, unspecified, not applicable or unspecified: Secondary | ICD-10-CM

## 2014-07-25 DIAGNOSIS — O358XX0 Maternal care for other (suspected) fetal abnormality and damage, not applicable or unspecified: Secondary | ICD-10-CM

## 2014-07-25 DIAGNOSIS — O30042 Twin pregnancy, dichorionic/diamniotic, second trimester: Secondary | ICD-10-CM

## 2014-07-25 LAB — POCT URINALYSIS DIP (DEVICE)
BILIRUBIN URINE: NEGATIVE
Glucose, UA: NEGATIVE mg/dL
HGB URINE DIPSTICK: NEGATIVE
Ketones, ur: NEGATIVE mg/dL
Nitrite: NEGATIVE
PH: 7 (ref 5.0–8.0)
Protein, ur: NEGATIVE mg/dL
SPECIFIC GRAVITY, URINE: 1.02 (ref 1.005–1.030)
Urobilinogen, UA: 0.2 mg/dL (ref 0.0–1.0)

## 2014-07-25 NOTE — Progress Notes (Signed)
Doing well today. +FM. No CTX, LOF, VB.  1. Vaginal itching. Wet prep last week negative. On exam labia dry, no discharge present. Reassurance provided. May use topical moisturizer.  2. A2GDM. At last visit AM Fasting glucose values elevated so started glyburide. Just picked up medication and has not started yet. AM Fasting: 84, 99, 110, 130. Postprandial at goal. Will start taking glyburide 2.5 mg nightly today. Will return to clinic in one week to reassess blood glucose control.  3. Multiple fetal anomalies. Unable to meet with neonatologist last week. Has appointment with MFM next week.  4. Routine PNC. Labs reviewed. FM/PTL precautions reviewed.

## 2014-07-25 NOTE — Progress Notes (Signed)
C/o of pelvic pressure and occasional contractions.  Continues to c/o white discharge with vaginal itching on labia only.

## 2014-07-29 ENCOUNTER — Other Ambulatory Visit (HOSPITAL_COMMUNITY): Payer: Self-pay | Admitting: Maternal and Fetal Medicine

## 2014-07-29 DIAGNOSIS — O4103X2 Oligohydramnios, third trimester, fetus 2: Secondary | ICD-10-CM

## 2014-07-29 DIAGNOSIS — O30043 Twin pregnancy, dichorionic/diamniotic, third trimester: Secondary | ICD-10-CM

## 2014-07-29 DIAGNOSIS — O359XX2 Maternal care for (suspected) fetal abnormality and damage, unspecified, fetus 2: Secondary | ICD-10-CM

## 2014-08-01 ENCOUNTER — Ambulatory Visit (INDEPENDENT_AMBULATORY_CARE_PROVIDER_SITE_OTHER): Payer: BC Managed Care – PPO | Admitting: Obstetrics & Gynecology

## 2014-08-01 VITALS — BP 108/72 | HR 107 | Temp 98.0°F | Wt 252.4 lb

## 2014-08-01 DIAGNOSIS — O30042 Twin pregnancy, dichorionic/diamniotic, second trimester: Secondary | ICD-10-CM

## 2014-08-01 DIAGNOSIS — O24419 Gestational diabetes mellitus in pregnancy, unspecified control: Secondary | ICD-10-CM

## 2014-08-01 DIAGNOSIS — Z23 Encounter for immunization: Secondary | ICD-10-CM

## 2014-08-01 LAB — POCT URINALYSIS DIP (DEVICE)
Bilirubin Urine: NEGATIVE
Glucose, UA: 250 mg/dL — AB
Hgb urine dipstick: NEGATIVE
Ketones, ur: NEGATIVE mg/dL
Leukocytes, UA: NEGATIVE
Nitrite: NEGATIVE
Protein, ur: 30 mg/dL — AB
Specific Gravity, Urine: 1.02 (ref 1.005–1.030)
Urobilinogen, UA: 0.2 mg/dL (ref 0.0–1.0)
pH: 6.5 (ref 5.0–8.0)

## 2014-08-01 NOTE — Progress Notes (Signed)
Patient forgot sugar log at home but reports good BS. Will reevaluate next week. Continue Glyburide 2.5 mg po qhs. Follow up ultrasound scheduled at MFM on 08/03/14; BPP added on given that she will be [redacted] weeks GA Will start 2X/week testing here next week.  Flu vaccine today. No other complaints or concerns.  Labor and fetal movement precautions reviewed.

## 2014-08-03 ENCOUNTER — Ambulatory Visit (HOSPITAL_COMMUNITY)
Admission: RE | Admit: 2014-08-03 | Discharge: 2014-08-03 | Disposition: A | Discharge status: Home / Self Care | Payer: BC Managed Care – PPO | Source: Ambulatory Visit | Attending: Obstetrics & Gynecology | Admitting: Obstetrics & Gynecology

## 2014-08-03 ENCOUNTER — Encounter (HOSPITAL_COMMUNITY): Payer: Self-pay

## 2014-08-03 ENCOUNTER — Other Ambulatory Visit: Payer: Self-pay | Admitting: Obstetrics & Gynecology

## 2014-08-03 ENCOUNTER — Ambulatory Visit (HOSPITAL_COMMUNITY): Payer: BC Managed Care – PPO

## 2014-08-03 DIAGNOSIS — O4103X2 Oligohydramnios, third trimester, fetus 2: Secondary | ICD-10-CM

## 2014-08-03 DIAGNOSIS — Z3A32 32 weeks gestation of pregnancy: Secondary | ICD-10-CM | POA: Insufficient documentation

## 2014-08-03 DIAGNOSIS — O358XX2 Maternal care for other (suspected) fetal abnormality and damage, fetus 2: Secondary | ICD-10-CM | POA: Insufficient documentation

## 2014-08-03 DIAGNOSIS — O24419 Gestational diabetes mellitus in pregnancy, unspecified control: Secondary | ICD-10-CM

## 2014-08-03 DIAGNOSIS — O30042 Twin pregnancy, dichorionic/diamniotic, second trimester: Secondary | ICD-10-CM

## 2014-08-03 DIAGNOSIS — O4103X1 Oligohydramnios, third trimester, fetus 1: Secondary | ICD-10-CM | POA: Diagnosis not present

## 2014-08-03 DIAGNOSIS — O4103X0 Oligohydramnios, third trimester, not applicable or unspecified: Secondary | ICD-10-CM | POA: Diagnosis present

## 2014-08-03 DIAGNOSIS — O359XX2 Maternal care for (suspected) fetal abnormality and damage, unspecified, fetus 2: Secondary | ICD-10-CM

## 2014-08-03 DIAGNOSIS — O359XX0 Maternal care for (suspected) fetal abnormality and damage, unspecified, not applicable or unspecified: Secondary | ICD-10-CM | POA: Insufficient documentation

## 2014-08-03 DIAGNOSIS — O30043 Twin pregnancy, dichorionic/diamniotic, third trimester: Secondary | ICD-10-CM | POA: Insufficient documentation

## 2014-08-03 DIAGNOSIS — O24414 Gestational diabetes mellitus in pregnancy, insulin controlled: Secondary | ICD-10-CM | POA: Diagnosis not present

## 2014-08-03 DIAGNOSIS — Z3A31 31 weeks gestation of pregnancy: Secondary | ICD-10-CM | POA: Insufficient documentation

## 2014-08-03 DIAGNOSIS — O4100X0 Oligohydramnios, unspecified trimester, not applicable or unspecified: Secondary | ICD-10-CM | POA: Insufficient documentation

## 2014-08-04 ENCOUNTER — Telehealth: Payer: Self-pay | Admitting: *Deleted

## 2014-08-04 NOTE — Telephone Encounter (Signed)
Per consult with Dr. Erin FullingHarraway-Smith yesterday, pt does not need NST appt on 2/12 as scheduled since NST was reactive on both babies. I called pt and informed her of this change. I also stated that beginning next week she will have weekly NST in our office every Monday and NST/BPP every Thursday @ MFM dept.  Pt voiced understanding and had no further questions.

## 2014-08-05 ENCOUNTER — Other Ambulatory Visit: Payer: BC Managed Care – PPO

## 2014-08-08 ENCOUNTER — Other Ambulatory Visit: Payer: BC Managed Care – PPO

## 2014-08-08 ENCOUNTER — Encounter: Payer: BC Managed Care – PPO | Admitting: Obstetrics and Gynecology

## 2014-08-11 ENCOUNTER — Ambulatory Visit (HOSPITAL_COMMUNITY)
Admission: RE | Admit: 2014-08-11 | Discharge: 2014-08-11 | Disposition: A | Discharge status: Home / Self Care | Payer: BC Managed Care – PPO | Source: Ambulatory Visit | Attending: Obstetrics & Gynecology | Admitting: Obstetrics & Gynecology

## 2014-08-11 ENCOUNTER — Encounter (HOSPITAL_COMMUNITY): Payer: Self-pay

## 2014-08-11 ENCOUNTER — Other Ambulatory Visit: Payer: BC Managed Care – PPO

## 2014-08-11 DIAGNOSIS — O30043 Twin pregnancy, dichorionic/diamniotic, third trimester: Secondary | ICD-10-CM | POA: Diagnosis not present

## 2014-08-11 DIAGNOSIS — O283 Abnormal ultrasonic finding on antenatal screening of mother: Secondary | ICD-10-CM | POA: Insufficient documentation

## 2014-08-11 DIAGNOSIS — O358XX2 Maternal care for other (suspected) fetal abnormality and damage, fetus 2: Secondary | ICD-10-CM | POA: Insufficient documentation

## 2014-08-11 DIAGNOSIS — O4103X0 Oligohydramnios, third trimester, not applicable or unspecified: Secondary | ICD-10-CM | POA: Diagnosis present

## 2014-08-11 DIAGNOSIS — O24414 Gestational diabetes mellitus in pregnancy, insulin controlled: Secondary | ICD-10-CM | POA: Insufficient documentation

## 2014-08-11 DIAGNOSIS — Z3A33 33 weeks gestation of pregnancy: Secondary | ICD-10-CM | POA: Diagnosis not present

## 2014-08-11 DIAGNOSIS — O4103X2 Oligohydramnios, third trimester, fetus 2: Secondary | ICD-10-CM | POA: Diagnosis not present

## 2014-08-11 DIAGNOSIS — O24419 Gestational diabetes mellitus in pregnancy, unspecified control: Secondary | ICD-10-CM

## 2014-08-11 DIAGNOSIS — O30042 Twin pregnancy, dichorionic/diamniotic, second trimester: Secondary | ICD-10-CM

## 2014-08-15 ENCOUNTER — Ambulatory Visit (INDEPENDENT_AMBULATORY_CARE_PROVIDER_SITE_OTHER): Payer: BC Managed Care – PPO | Admitting: Obstetrics and Gynecology

## 2014-08-15 ENCOUNTER — Encounter: Payer: Self-pay | Admitting: Obstetrics and Gynecology

## 2014-08-15 ENCOUNTER — Encounter: Payer: BC Managed Care – PPO | Admitting: Obstetrics & Gynecology

## 2014-08-15 VITALS — BP 115/74 | HR 99 | Temp 97.7°F | Wt 259.0 lb

## 2014-08-15 DIAGNOSIS — O24419 Gestational diabetes mellitus in pregnancy, unspecified control: Secondary | ICD-10-CM

## 2014-08-15 DIAGNOSIS — O30043 Twin pregnancy, dichorionic/diamniotic, third trimester: Secondary | ICD-10-CM | POA: Diagnosis not present

## 2014-08-15 DIAGNOSIS — O30042 Twin pregnancy, dichorionic/diamniotic, second trimester: Secondary | ICD-10-CM

## 2014-08-15 DIAGNOSIS — O4103X2 Oligohydramnios, third trimester, fetus 2: Secondary | ICD-10-CM | POA: Diagnosis not present

## 2014-08-15 LAB — POCT URINALYSIS DIP (DEVICE)
Bilirubin Urine: NEGATIVE
Glucose, UA: NEGATIVE mg/dL
HGB URINE DIPSTICK: NEGATIVE
Ketones, ur: NEGATIVE mg/dL
Nitrite: NEGATIVE
Protein, ur: 30 mg/dL — AB
Specific Gravity, Urine: 1.02 (ref 1.005–1.030)
Urobilinogen, UA: 0.2 mg/dL (ref 0.0–1.0)
pH: 7 (ref 5.0–8.0)

## 2014-08-15 NOTE — Progress Notes (Signed)
Patient is doing well without complaints. She reports occasional contractions. Patient did not bring CBG log but reports highest fasting 99 (majority being in the 70's) and highest pp 131 (with the majority being in the 90's). Follow up MFM scan on 2/25. FM/PTL precautions reviewed NST reviewed and reactive

## 2014-08-15 NOTE — Progress Notes (Signed)
Weekly AFI and NST @ MFM qThursday. Next growth US scheduled 3/10.  Pt desires NICU visit.

## 2014-08-18 ENCOUNTER — Ambulatory Visit (HOSPITAL_COMMUNITY)
Admission: RE | Admit: 2014-08-18 | Discharge: 2014-08-18 | Disposition: A | Discharge status: Home / Self Care | Payer: BC Managed Care – PPO | Source: Ambulatory Visit | Attending: Obstetrics & Gynecology | Admitting: Obstetrics & Gynecology

## 2014-08-18 ENCOUNTER — Other Ambulatory Visit: Payer: Self-pay | Admitting: Obstetrics & Gynecology

## 2014-08-18 DIAGNOSIS — O358XX2 Maternal care for other (suspected) fetal abnormality and damage, fetus 2: Secondary | ICD-10-CM | POA: Diagnosis not present

## 2014-08-18 DIAGNOSIS — O30043 Twin pregnancy, dichorionic/diamniotic, third trimester: Secondary | ICD-10-CM | POA: Insufficient documentation

## 2014-08-18 DIAGNOSIS — O24414 Gestational diabetes mellitus in pregnancy, insulin controlled: Secondary | ICD-10-CM | POA: Insufficient documentation

## 2014-08-18 DIAGNOSIS — O24419 Gestational diabetes mellitus in pregnancy, unspecified control: Secondary | ICD-10-CM

## 2014-08-18 DIAGNOSIS — O30042 Twin pregnancy, dichorionic/diamniotic, second trimester: Secondary | ICD-10-CM

## 2014-08-18 DIAGNOSIS — O4103X2 Oligohydramnios, third trimester, fetus 2: Secondary | ICD-10-CM | POA: Insufficient documentation

## 2014-08-18 DIAGNOSIS — O359XX0 Maternal care for (suspected) fetal abnormality and damage, unspecified, not applicable or unspecified: Secondary | ICD-10-CM | POA: Insufficient documentation

## 2014-08-18 DIAGNOSIS — Z3A34 34 weeks gestation of pregnancy: Secondary | ICD-10-CM | POA: Diagnosis not present

## 2014-08-18 DIAGNOSIS — O4103X0 Oligohydramnios, third trimester, not applicable or unspecified: Secondary | ICD-10-CM | POA: Diagnosis present

## 2014-08-18 NOTE — ED Notes (Signed)
Called Dr. Eric FormWimmer to request a NICU consult.  He will be down to talk with pt.

## 2014-08-18 NOTE — Consult Note (Signed)
Asked by Dr.Quinn to provide prenatal consultation for patient being seen in MFM clinic at 82 weeks with di/di female twins, one of which has multiple anomalies by ultrasound including severe hydronephrosis and dextroposition of the heart (but no apparent structural cardiac defects).  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  Chest continues to appear small subjectively but there is amniotic fluid on latest [place]. Neonatology consulted to discuss delivery plan ([facility] vs [facility]) and also general expectations for delivery room and subsequent NICU management of affected twin.  Met with patient and FOB - recommended delivery at [facility] since no apparent cardiac lesion which would require urgent surgical intervention.  Discussed twin B's findings and possibility of lethal lung hypoplasia or renal dysfunction, but also uncertainty of prognosis in light of increased presence of amniotic fluid recently.  I informed them that NICU team would be present at delivery and that intervention would depend on twin B's presentation and responsiveness. Re-emphasized that resuscitation might be futile if there was severe pulmonary hypoplasia.  Suggested f/u Neonatology consultation prior to delivery (C/section planned for 3/23); also suggested NICU tour which could possibly be arranged during one of her subsequent clinic visits.  Patient was attentive, had appropriate questions, and was appreciative of my input.  Thank you for the consulting Neonatology  Total time 40 minutes, half of which was face-to-face  John E. Burney Gauze., MD

## 2014-08-22 ENCOUNTER — Ambulatory Visit (INDEPENDENT_AMBULATORY_CARE_PROVIDER_SITE_OTHER): Payer: BC Managed Care – PPO | Admitting: Obstetrics & Gynecology

## 2014-08-22 ENCOUNTER — Other Ambulatory Visit: Payer: Self-pay | Admitting: Obstetrics & Gynecology

## 2014-08-22 VITALS — BP 107/73 | HR 96 | Temp 97.8°F | Wt 262.4 lb

## 2014-08-22 DIAGNOSIS — O4103X2 Oligohydramnios, third trimester, fetus 2: Secondary | ICD-10-CM

## 2014-08-22 DIAGNOSIS — O30043 Twin pregnancy, dichorionic/diamniotic, third trimester: Secondary | ICD-10-CM

## 2014-08-22 DIAGNOSIS — O358XX2 Maternal care for other (suspected) fetal abnormality and damage, fetus 2: Secondary | ICD-10-CM

## 2014-08-22 DIAGNOSIS — O359XX2 Maternal care for (suspected) fetal abnormality and damage, unspecified, fetus 2: Secondary | ICD-10-CM

## 2014-08-22 LAB — OB RESULTS CONSOLE GBS: GBS: POSITIVE

## 2014-08-22 LAB — OB RESULTS CONSOLE GC/CHLAMYDIA
Chlamydia: NEGATIVE
Gonorrhea: NEGATIVE

## 2014-08-22 LAB — POCT URINALYSIS DIP (DEVICE)
Bilirubin Urine: NEGATIVE
GLUCOSE, UA: NEGATIVE mg/dL
Hgb urine dipstick: NEGATIVE
Ketones, ur: NEGATIVE mg/dL
Nitrite: NEGATIVE
PROTEIN: 100 mg/dL — AB
Specific Gravity, Urine: 1.02 (ref 1.005–1.030)
UROBILINOGEN UA: 0.2 mg/dL (ref 0.0–1.0)
pH: 7 (ref 5.0–8.0)

## 2014-08-22 MED ORDER — FLUCONAZOLE 150 MG PO TABS: 150.0000 mg | ORAL_TABLET | Freq: Once | ORAL | Status: DC

## 2014-08-22 NOTE — Patient Instructions (Signed)
Third Trimester of Pregnancy The third trimester is from week 29 through week 42, months 7 through 9. The third trimester is a time when the fetus is growing rapidly. At the end of the ninth month, the fetus is about 20 inches in length and weighs 6-10 pounds.  BODY CHANGES Your body goes through many changes during pregnancy. The changes vary from woman to woman.   Your weight will continue to increase. You can expect to gain 25-35 pounds (11-16 kg) by the end of the pregnancy.  You may begin to get stretch marks on your hips, abdomen, and breasts.  You may urinate more often because the fetus is moving lower into your pelvis and pressing on your bladder.  You may develop or continue to have heartburn as a result of your pregnancy.  You may develop constipation because certain hormones are causing the muscles that push waste through your intestines to slow down.  You may develop hemorrhoids or swollen, bulging veins (varicose veins).  You may have pelvic pain because of the weight gain and pregnancy hormones relaxing your joints between the bones in your pelvis. Backaches may result from overexertion of the muscles supporting your posture.  You may have changes in your hair. These can include thickening of your hair, rapid growth, and changes in texture. Some women also have hair loss during or after pregnancy, or hair that feels dry or thin. Your hair will most likely return to normal after your baby is born.  Your breasts will continue to grow and be tender. A yellow discharge may leak from your breasts called colostrum.  Your belly button may stick out.  You may feel short of breath because of your expanding uterus.  You may notice the fetus "dropping," or moving lower in your abdomen.  You may have a bloody mucus discharge. This usually occurs a few days to a week before labor begins.  Your cervix becomes thin and soft (effaced) near your due date. WHAT TO EXPECT AT YOUR PRENATAL  EXAMS  You will have prenatal exams every 2 weeks until week 36. Then, you will have weekly prenatal exams. During a routine prenatal visit:  You will be weighed to make sure you and the fetus are growing normally.  Your blood pressure is taken.  Your abdomen will be measured to track your baby's growth.  The fetal heartbeat will be listened to.  Any test results from the previous visit will be discussed.  You may have a cervical check near your due date to see if you have effaced. At around 36 weeks, your caregiver will check your cervix. At the same time, your caregiver will also perform a test on the secretions of the vaginal tissue. This test is to determine if a type of bacteria, Group B streptococcus, is present. Your caregiver will explain this further. Your caregiver may ask you:  What your birth plan is.  How you are feeling.  If you are feeling the baby move.  If you have had any abnormal symptoms, such as leaking fluid, bleeding, severe headaches, or abdominal cramping.  If you have any questions. Other tests or screenings that may be performed during your third trimester include:  Blood tests that check for low iron levels (anemia).  Fetal testing to check the health, activity level, and growth of the fetus. Testing is done if you have certain medical conditions or if there are problems during the pregnancy. FALSE LABOR You may feel small, irregular contractions that   eventually go away. These are called Braxton Hicks contractions, or false labor. Contractions may last for hours, days, or even weeks before true labor sets in. If contractions come at regular intervals, intensify, or become painful, it is best to be seen by your caregiver.  SIGNS OF LABOR   Menstrual-like cramps.  Contractions that are 5 minutes apart or less.  Contractions that start on the top of the uterus and spread down to the lower abdomen and back.  A sense of increased pelvic pressure or back  pain.  A watery or bloody mucus discharge that comes from the vagina. If you have any of these signs before the 37th week of pregnancy, call your caregiver right away. You need to go to the hospital to get checked immediately. HOME CARE INSTRUCTIONS   Avoid all smoking, herbs, alcohol, and unprescribed drugs. These chemicals affect the formation and growth of the baby.  Follow your caregiver's instructions regarding medicine use. There are medicines that are either safe or unsafe to take during pregnancy.  Exercise only as directed by your caregiver. Experiencing uterine cramps is a good sign to stop exercising.  Continue to eat regular, healthy meals.  Wear a good support bra for breast tenderness.  Do not use hot tubs, steam rooms, or saunas.  Wear your seat belt at all times when driving.  Avoid raw meat, uncooked cheese, cat litter boxes, and soil used by cats. These carry germs that can cause birth defects in the baby.  Take your prenatal vitamins.  Try taking a stool softener (if your caregiver approves) if you develop constipation. Eat more high-fiber foods, such as fresh vegetables or fruit and whole grains. Drink plenty of fluids to keep your urine clear or pale yellow.  Take warm sitz baths to soothe any pain or discomfort caused by hemorrhoids. Use hemorrhoid cream if your caregiver approves.  If you develop varicose veins, wear support hose. Elevate your feet for 15 minutes, 3-4 times a day. Limit salt in your diet.  Avoid heavy lifting, wear low heal shoes, and practice good posture.  Rest a lot with your legs elevated if you have leg cramps or low back pain.  Visit your dentist if you have not gone during your pregnancy. Use a soft toothbrush to brush your teeth and be gentle when you floss.  A sexual relationship may be continued unless your caregiver directs you otherwise.  Do not travel far distances unless it is absolutely necessary and only with the approval  of your caregiver.  Take prenatal classes to understand, practice, and ask questions about the labor and delivery.  Make a trial run to the hospital.  Pack your hospital bag.  Prepare the baby's nursery.  Continue to go to all your prenatal visits as directed by your caregiver. SEEK MEDICAL CARE IF:  You are unsure if you are in labor or if your water has broken.  You have dizziness.  You have mild pelvic cramps, pelvic pressure, or nagging pain in your abdominal area.  You have persistent nausea, vomiting, or diarrhea.  You have a bad smelling vaginal discharge.  You have pain with urination. SEEK IMMEDIATE MEDICAL CARE IF:   You have a fever.  You are leaking fluid from your vagina.  You have spotting or bleeding from your vagina.  You have severe abdominal cramping or pain.  You have rapid weight loss or gain.  You have shortness of breath with chest pain.  You notice sudden or extreme swelling   of your face, hands, ankles, feet, or legs.  You have not felt your baby move in over an hour.  You have severe headaches that do not go away with medicine.  You have vision changes. Document Released: 06/04/2001 Document Revised: 06/15/2013 Document Reviewed: 08/11/2012 ExitCare Patient Information 2015 [employer]. This information is not intended to replace advice given to you by your health care provider. Make sure you discuss any questions you have with your health care provider.  

## 2014-08-22 NOTE — Progress Notes (Signed)
US limited in [facility] 2MFC 2/25, baby A breech. CT GC GBS today. CS 38 weeks recommended Vulvar itch and d/c noted.Did not bring BG today NST reactive

## 2014-08-22 NOTE — Progress Notes (Signed)
Pt states she checks her blood sugar 2-3 times daily. Pt states she has white vaginal d/c and itching. Pt requests to begin FMLA on 3/4 due to increasing physical limitations and discomfort while working. Weekly visits @ MFM for AFI/NST on Thursdays, next growth US on 3/10.

## 2014-08-23 ENCOUNTER — Encounter: Payer: Self-pay | Admitting: General Practice

## 2014-08-23 ENCOUNTER — Encounter: Payer: Self-pay | Admitting: *Deleted

## 2014-08-23 LAB — WET PREP, GENITAL
Clue Cells Wet Prep HPF POC: NONE SEEN
Trich, Wet Prep: NONE SEEN
YEAST WET PREP: NONE SEEN

## 2014-08-23 LAB — CULTURE, BETA STREP (GROUP B ONLY)

## 2014-08-23 LAB — GC/CHLAMYDIA PROBE AMP
CT Probe RNA: NEGATIVE
GC Probe RNA: NEGATIVE

## 2014-08-23 NOTE — Progress Notes (Signed)
FMLA completed and faxed.

## 2014-08-24 ENCOUNTER — Inpatient Hospital Stay (HOSPITAL_COMMUNITY)
Admission: AD | Admit: 2014-08-24 | Discharge: 2014-08-24 | Disposition: A | Discharge status: Home / Self Care | Payer: BC Managed Care – PPO | Source: Ambulatory Visit | Attending: Obstetrics & Gynecology | Admitting: Obstetrics & Gynecology

## 2014-08-24 ENCOUNTER — Encounter (HOSPITAL_COMMUNITY): Payer: Self-pay | Admitting: *Deleted

## 2014-08-24 DIAGNOSIS — R030 Elevated blood-pressure reading, without diagnosis of hypertension: Secondary | ICD-10-CM | POA: Diagnosis present

## 2014-08-24 DIAGNOSIS — Z3A35 35 weeks gestation of pregnancy: Secondary | ICD-10-CM | POA: Diagnosis not present

## 2014-08-24 DIAGNOSIS — R51 Headache: Secondary | ICD-10-CM | POA: Insufficient documentation

## 2014-08-24 DIAGNOSIS — O30042 Twin pregnancy, dichorionic/diamniotic, second trimester: Secondary | ICD-10-CM

## 2014-08-24 DIAGNOSIS — O30043 Twin pregnancy, dichorionic/diamniotic, third trimester: Secondary | ICD-10-CM

## 2014-08-24 DIAGNOSIS — O9989 Other specified diseases and conditions complicating pregnancy, childbirth and the puerperium: Secondary | ICD-10-CM | POA: Insufficient documentation

## 2014-08-24 DIAGNOSIS — O30003 Twin pregnancy, unspecified number of placenta and unspecified number of amniotic sacs, third trimester: Secondary | ICD-10-CM | POA: Insufficient documentation

## 2014-08-24 DIAGNOSIS — R519 Headache, unspecified: Secondary | ICD-10-CM

## 2014-08-24 LAB — URINALYSIS, ROUTINE W REFLEX MICROSCOPIC
Bilirubin Urine: NEGATIVE
Glucose, UA: NEGATIVE mg/dL
Hgb urine dipstick: NEGATIVE
KETONES UR: NEGATIVE mg/dL
Nitrite: NEGATIVE
PH: 7 (ref 5.0–8.0)
PROTEIN: NEGATIVE mg/dL
SPECIFIC GRAVITY, URINE: 1.02 (ref 1.005–1.030)
UROBILINOGEN UA: 0.2 mg/dL (ref 0.0–1.0)

## 2014-08-24 LAB — URINE MICROSCOPIC-ADD ON

## 2014-08-24 LAB — COMPREHENSIVE METABOLIC PANEL
ALT: 7 U/L (ref 0–35)
AST: 15 U/L (ref 0–37)
Albumin: 2.6 g/dL — ABNORMAL LOW (ref 3.5–5.2)
Alkaline Phosphatase: 115 U/L (ref 39–117)
Anion gap: 8 (ref 5–15)
BUN: 8 mg/dL (ref 6–23)
CO2: 20 mmol/L (ref 19–32)
Calcium: 8.3 mg/dL — ABNORMAL LOW (ref 8.4–10.5)
Chloride: 108 mmol/L (ref 96–112)
Creatinine, Ser: 0.59 mg/dL (ref 0.50–1.10)
GFR calc Af Amer: >90 mL/min (ref >90)
GFR calc non Af Amer: >90 mL/min (ref >90)
Glucose, Bld: 110 mg/dL — ABNORMAL HIGH (ref 70–99)
Potassium: 3.5 mmol/L (ref 3.5–5.1)
Sodium: 136 mmol/L (ref 135–145)
Total Bilirubin: 0.4 mg/dL (ref 0.3–1.2)
Total Protein: 6.7 g/dL (ref 6.0–8.3)

## 2014-08-24 LAB — CBC
HCT: 29.8 % — ABNORMAL LOW (ref 36.0–46.0)
Hemoglobin: 10.3 g/dL — ABNORMAL LOW (ref 12.0–15.0)
MCH: 33.1 pg (ref 26.0–34.0)
MCHC: 34.6 g/dL (ref 30.0–36.0)
MCV: 95.8 fL (ref 78.0–100.0)
Platelets: 224 10*3/uL (ref 150–400)
RBC: 3.11 MIL/uL — ABNORMAL LOW (ref 3.87–5.11)
RDW: 12.8 % (ref 11.5–15.5)
WBC: 7.4 10*3/uL (ref 4.0–10.5)

## 2014-08-24 LAB — PROTEIN / CREATININE RATIO, URINE
Creatinine, Urine: 79 mg/dL
Protein Creatinine Ratio: 0.48 — ABNORMAL HIGH (ref 0.00–0.15)
Total Protein, Urine: 38 mg/dL

## 2014-08-24 NOTE — MAU Provider Note (Signed)
History     CSN: [id number]  Arrival date and time: 08/24/14 1202   None     Chief Complaint  Patient presents with  . Headache  . Hypertension   HPI  24 y.o.G2P0010  twin gestation presents to MAU withcomplaint of elevated blood pressure and headache at work. Denies vaginal bleeding, contractions, LOF. Reports positive fetal movement.   Past Medical History  Diagnosis Date  . Miscarriage   . Obesity   . Asthma     childhood  . Gestational diabetes   . Genital herpes     Past Surgical History  Procedure Laterality Date  . No past surgeries      Family History  Problem Relation Age of Onset  . Diabetes Maternal Grandmother   . Hypertension Maternal Grandmother   . Cancer Maternal Grandfather   . Diabetes Father     History  Substance Use Topics  . Smoking status: Never Smoker   . Smokeless tobacco: Never Used  . Alcohol Use: No     Comment: social    Allergies:  Allergies  Allergen Reactions  . Bactrim [Sulfamethoxazole-Trimethoprim] Hives and Itching    Prescriptions prior to admission  Medication Sig Dispense Refill Last Dose  . Ferrous Sulfate (SLOW FE PO) Take 1 tablet by mouth daily.   08/24/2014 at Unknown time  . glyBURIDE (DIABETA) 2.5 MG tablet Take 1 tablet (2.5 mg total) by mouth every evening. 30 tablet 3 08/23/2014 at Unknown time  . OVER THE COUNTER MEDICATION Take 2 tablets by mouth daily. Prenatal Gummies.   08/24/2014 at Unknown time  . ACCU-CHEK FASTCLIX LANCETS MISC Inject 1 each into the skin 4 (four) times daily. 191.47 for testing 4 times daily 102 each 12 Taking  . fluconazole (DIFLUCAN) 150 MG tablet Take 1 tablet (150 mg total) by mouth once. (Patient not taking: Reported on 08/24/2014) 1 tablet 1 Completed Course at Unknown time  . glucose blood (ACCU-CHEK SMARTVIEW) test strip Use as instructed to test blood sugar 4 times daily 50 each 12 Taking  . ranitidine (ZANTAC) 150 MG capsule Take 1 capsule (150 mg total) by mouth daily.  (Patient not taking: Reported on 08/15/2014) 30 capsule 2 Not Taking  . valACYclovir (VALTREX) 500 MG tablet Take 500 mg by mouth 2 (two) times daily. Prescription written as; take twice daily until outbreak area clear, then take once daily.  2 08/21/2014    Review of Systems  Constitutional: Negative.   Eyes: Negative for blurred vision, double vision, photophobia and pain.  Respiratory: Negative for shortness of breath.   Cardiovascular: Negative for leg swelling.  Gastrointestinal: Negative for nausea, vomiting and abdominal pain.  Genitourinary: Negative for dysuria, urgency and frequency.  Musculoskeletal: Negative.   Skin: Negative.   Neurological: Positive for dizziness and headaches.  Endo/Heme/Allergies: Negative.   Psychiatric/Behavioral: Negative.    Physical Exam   Blood pressure 130/79, pulse 101, temperature 98.3 F (36.8 C), resp. rate 18, last menstrual period 12/22/2013.  Physical Exam  Constitutional: She is oriented to person, place, and time. She appears well-developed and well-nourished. No distress.  HENT:  Head: Normocephalic and atraumatic.  Neck: Normal range of motion.  Cardiovascular: Normal rate.   Respiratory: Effort normal. No respiratory distress.  GI: Soft. She exhibits no distension and no mass. There is no tenderness. There is no rebound and no guarding.  Musculoskeletal: Normal range of motion. She exhibits no edema.  Neurological: She is alert and oriented to person, place, and time. She has  normal reflexes. She displays normal reflexes. She exhibits normal muscle tone.  Skin: Skin is warm and dry.  Psychiatric: She has a normal mood and affect. Her behavior is normal. Judgment and thought content normal.    MAU Course  Procedures  Pre E labs Serial B/p's 08/24/14 1306  --  101  --  --  130/79 mmHg  --  --  --  -- KW     08/24/14 1256  --  82  --  --  125/77 mmHg  --  --  --  -- KW    08/24/14 1246  --  90  --  --  132/75 mmHg  --  --  --   -- KW    08/24/14 1236  98.3 F (36.8 C)  98  --  18  124/82 mmHg  --  --  --  -- KW    08/24/14 1231  --  96  --  --  136/86 mmHg  --  --  --  -- KW       Baby A Cat 1 FHT Baby B Cat 1 FHT Results for orders placed or performed during the hospital encounter of 08/24/14 (from the past 24 hour(s))  Urinalysis, Routine w reflex microscopic     Status: Abnormal   Collection Time: 08/24/14 12:05 PM  Result Value Ref Range   Color, Urine YELLOW YELLOW   APPearance HAZY (A) CLEAR   Specific Gravity, Urine 1.020 1.005 - 1.030   pH 7.0 5.0 - 8.0   Glucose, UA NEGATIVE NEGATIVE mg/dL   Hgb urine dipstick NEGATIVE NEGATIVE   Bilirubin Urine NEGATIVE NEGATIVE   Ketones, ur NEGATIVE NEGATIVE mg/dL   Protein, ur NEGATIVE NEGATIVE mg/dL   Urobilinogen, UA 0.2 0.0 - 1.0 mg/dL   Nitrite NEGATIVE NEGATIVE   Leukocytes, UA MODERATE (A) NEGATIVE  Protein / creatinine ratio, urine     Status: Abnormal   Collection Time: 08/24/14 12:05 PM  Result Value Ref Range   Creatinine, Urine 79.00 mg/dL   Total Protein, Urine 38 mg/dL   Protein Creatinine Ratio 0.48 (H) 0.00 - 0.15  Urine microscopic-add on     Status: Abnormal   Collection Time: 08/24/14 12:05 PM  Result Value Ref Range   Squamous Epithelial / LPF MANY (A) RARE   WBC, UA 3-6 <3 WBC/hpf   RBC / HPF 3-6 <3 RBC/hpf   Bacteria, UA MANY (A) RARE  CBC     Status: Abnormal   Collection Time: 08/24/14 12:54 PM  Result Value Ref Range   WBC 7.4 4.0 - 10.5 K/uL   RBC 3.11 (L) 3.87 - 5.11 MIL/uL   Hemoglobin 10.3 (L) 12.0 - 15.0 g/dL   HCT 09.829.8 (L) 11.936.0 - 14.746.0 %   MCV 95.8 78.0 - 100.0 fL   MCH 33.1 26.0 - 34.0 pg   MCHC 34.6 30.0 - 36.0 g/dL   RDW 82.912.8 56.211.5 - 13.015.5 %   Platelets 224 150 - 400 K/uL  Comprehensive metabolic panel     Status: Abnormal   Collection Time: 08/24/14 12:54 PM  Result Value Ref Range   Sodium 136 135 - 145 mmol/L   Potassium 3.5 3.5 - 5.1 mmol/L   Chloride 108 96 - 112 mmol/L   CO2 20 19 - 32 mmol/L    Glucose, Bld 110 (H) 70 - 99 mg/dL   BUN 8 6 - 23 mg/dL   Creatinine, Ser 8.650.59 0.50 - 1.10 mg/dL   Calcium 8.3 (L) 8.4 -  10.5 mg/dL   Total Protein 6.7 6.0 - 8.3 g/dL   Albumin 2.6 (L) 3.5 - 5.2 g/dL   AST 15 0 - 37 U/L   ALT 7 0 - 35 U/L   Alkaline Phosphatase 115 39 - 117 U/L   Total Bilirubin 0.4 0.3 - 1.2 mg/dL   GFR calc non Af Amer >90 >90 mL/min   GFR calc Af Amer >90 >90 mL/min   Anion gap 8 5 - 15    Assessment and Plan  IUP @ 35+0 Twin Gestation Headache  Discharge to home Keep next regular scheduled appt MFM appointment tomorrow   Clemmons,Lori Grissett 08/24/2014, 1:35 PM

## 2014-08-24 NOTE — Discharge Instructions (Signed)
General Headache Without Cause A headache is pain or discomfort felt around the head or neck area. The specific cause of a headache may not be found. There are many causes and types of headaches. A few common ones are:  Tension headaches.  Migraine headaches.  Cluster headaches.  Chronic daily headaches. HOME CARE INSTRUCTIONS   Keep all follow-up appointments with your caregiver or any specialist referral.  Only take over-the-counter or prescription medicines for pain or discomfort as directed by your caregiver.  Lie down in a dark, quiet room when you have a headache.  Keep a headache journal to find out what may trigger your migraine headaches. For example, write down:  What you eat and drink.  How much sleep you get.  Any change to your diet or medicines.  Try massage or other relaxation techniques.  Put ice packs or heat on the head and neck. Use these 3 to 4 times per day for 15 to 20 minutes each time, or as needed.  Limit stress.  Sit up straight, and do not tense your muscles.  Quit smoking if you smoke.  Limit alcohol use.  Decrease the amount of caffeine you drink, or stop drinking caffeine.  Eat and sleep on a regular schedule.  Get 7 to 9 hours of sleep, or as recommended by your caregiver.  Keep lights dim if bright lights bother you and make your headaches worse. SEEK MEDICAL CARE IF:   You have problems with the medicines you were prescribed.  Your medicines are not working.  You have a change from the usual headache.  You have nausea or vomiting. SEEK IMMEDIATE MEDICAL CARE IF:   Your headache becomes severe.  You have a fever.  You have a stiff neck.  You have loss of vision.  You have muscular weakness or loss of muscle control.  You start losing your balance or have trouble walking.  You feel faint or pass out.  You have severe symptoms that are different from your first symptoms. MAKE SURE YOU:   Understand these  instructions.  Will watch your condition.  Will get help right away if you are not doing well or get worse. Document Released: 06/10/2005 Document Revised: 09/02/2011 Document Reviewed: 06/26/2011 [facility] Patient Information 2015 [employer] [place]. This information is not intended to replace advice given to you by your health care provider. Make sure you discuss any questions you have with your health care provider. Preeclampsia and Eclampsia Preeclampsia is a serious condition that develops only during pregnancy. It is also called toxemia of pregnancy. This condition causes high blood pressure along with other symptoms, such as swelling and headaches. These may develop as the condition gets worse. Preeclampsia may occur 20 weeks or later into your pregnancy.  Diagnosing and treating preeclampsia early is very important. If not treated early, it can cause serious problems for you and your baby. One problem it can lead to is eclampsia, which is a condition that causes muscle jerking or shaking (convulsions) in the mother. Delivering your baby is the best treatment for preeclampsia or eclampsia.  RISK FACTORS The cause of preeclampsia is not known. You may be more likely to develop preeclampsia if you have certain risk factors. These include:   Being pregnant for the first time.  Having preeclampsia in a past pregnancy.  Having a family history of preeclampsia.  Having high blood pressure.  Being pregnant with twins or triplets.  Being 8235 or older.  Being African American.  Having kidney  disease or diabetes.  Having medical conditions such as lupus or blood diseases.  Being very overweight (obese). SIGNS AND SYMPTOMS  The earliest signs of preeclampsia are:  High blood pressure.  Increased protein in your urine. Your health care provider will check for this at every prenatal visit. Other symptoms that can develop include:   Severe headaches.  Sudden weight gain.  Swelling of  your hands, face, legs, and feet.  Feeling sick to your stomach (nauseous) and throwing up (vomiting).  Vision problems (blurred or double vision).  Numbness in your face, arms, legs, and feet.  Dizziness.  Slurred speech.  Sensitivity to bright lights.  Abdominal pain. DIAGNOSIS  There are no screening tests for preeclampsia. Your health care provider will ask you about symptoms and check for signs of preeclampsia during your prenatal visits. You may also have tests, including:  Urine testing.  Blood testing.  Checking your baby's heart rate.  Checking the health of your baby and your placenta using images created with sound waves (ultrasound). TREATMENT  You can work out the best treatment approach together with your health care provider. It is very important to keep all prenatal appointments. If you have an increased risk of preeclampsia, you may need more frequent prenatal exams.  Your health care provider may prescribe bed rest.  You may have to eat as little salt as possible.  You may need to take medicine to lower your blood pressure if the condition does not respond to more conservative measures.  You may need to stay in the hospital if your condition is severe. There, treatment will focus on controlling your blood pressure and fluid retention. You may also need to take medicine to prevent seizures.  If the condition gets worse, your baby may need to be delivered early to protect you and the baby. You may have your labor started with medicine (be induced), or you may have a cesarean delivery.  Preeclampsia usually goes away after the baby is born. HOME CARE INSTRUCTIONS   Only take over-the-counter or prescription medicines as directed by your health care provider.  Lie on your left side while resting. This keeps pressure off your baby.  Elevate your feet while resting.  Get regular exercise. Ask your health care provider what type of exercise is safe for  you.  Avoid caffeine and alcohol.  Do not smoke.  Drink 6-8 glasses of water every day.  Eat a balanced diet that is low in salt. Do not add salt to your food.  Avoid stressful situations as much as possible.  Get plenty of rest and sleep.  Keep all prenatal appointments and tests as scheduled. SEEK MEDICAL CARE IF:  You are gaining more weight than expected.  You have any headaches, abdominal pain, or nausea.  You are bruising more than usual.  You feel dizzy or light-headed. SEEK IMMEDIATE MEDICAL CARE IF:   You develop sudden or severe swelling anywhere in your body. This usually happens in the legs.  You gain 5 lb (2.3 kg) or more in a week.  You have a severe headache, dizziness, problems with your vision, or confusion.  You have severe abdominal pain.  You have lasting nausea or vomiting.  You have a seizure.  You have trouble moving any part of your body.  You develop numbness in your body.  You have trouble speaking.  You have any abnormal bleeding.  You develop a stiff neck.  You pass out. MAKE SURE YOU:   Understand  these instructions.  Will watch your condition.  Will get help right away if you are not doing well or get worse. Document Released: 06/07/2000 Document Revised: 06/15/2013 Document Reviewed: 04/02/2013 [facility] Patient Information 2015 [place], [place]. This information is not intended to replace advice given to you by your health care provider. Make sure you discuss any questions you have with your health care provider.

## 2014-08-24 NOTE — MAU Note (Signed)
Pt reports she has not felt good all day. C/o headache and fatigue. Took b/p at work and was told it was 150. Good fetal movement reported. C/o of cramping as well.

## 2014-08-25 ENCOUNTER — Encounter (HOSPITAL_COMMUNITY): Payer: Self-pay

## 2014-08-25 ENCOUNTER — Ambulatory Visit (HOSPITAL_COMMUNITY)
Admission: RE | Admit: 2014-08-25 | Discharge: 2014-08-25 | Disposition: A | Discharge status: Home / Self Care | Payer: BC Managed Care – PPO | Source: Ambulatory Visit | Attending: Obstetrics & Gynecology | Admitting: Obstetrics & Gynecology

## 2014-08-25 ENCOUNTER — Other Ambulatory Visit (HOSPITAL_COMMUNITY): Payer: Self-pay

## 2014-08-25 DIAGNOSIS — Z3A35 35 weeks gestation of pregnancy: Secondary | ICD-10-CM | POA: Insufficient documentation

## 2014-08-25 DIAGNOSIS — O30043 Twin pregnancy, dichorionic/diamniotic, third trimester: Secondary | ICD-10-CM | POA: Diagnosis not present

## 2014-08-25 DIAGNOSIS — O30009 Twin pregnancy, unspecified number of placenta and unspecified number of amniotic sacs, unspecified trimester: Secondary | ICD-10-CM | POA: Insufficient documentation

## 2014-08-25 DIAGNOSIS — O358XX0 Maternal care for other (suspected) fetal abnormality and damage, not applicable or unspecified: Secondary | ICD-10-CM | POA: Insufficient documentation

## 2014-08-25 DIAGNOSIS — O4103X2 Oligohydramnios, third trimester, fetus 2: Secondary | ICD-10-CM | POA: Diagnosis not present

## 2014-08-25 DIAGNOSIS — O30042 Twin pregnancy, dichorionic/diamniotic, second trimester: Secondary | ICD-10-CM

## 2014-08-25 DIAGNOSIS — O24414 Gestational diabetes mellitus in pregnancy, insulin controlled: Secondary | ICD-10-CM | POA: Insufficient documentation

## 2014-08-25 DIAGNOSIS — O24419 Gestational diabetes mellitus in pregnancy, unspecified control: Secondary | ICD-10-CM

## 2014-08-25 DIAGNOSIS — O4100X0 Oligohydramnios, unspecified trimester, not applicable or unspecified: Secondary | ICD-10-CM | POA: Insufficient documentation

## 2014-08-25 DIAGNOSIS — O4103X0 Oligohydramnios, third trimester, not applicable or unspecified: Secondary | ICD-10-CM | POA: Diagnosis present

## 2014-08-25 NOTE — Addendum Note (Signed)
Encounter addended by: Heidi DachMelanie A Jaciel Diem, RN on: 08/25/2014 10:29 AM<BR>     Documentation filed: Normajean GlasgowFlowsheet VN

## 2014-08-29 ENCOUNTER — Encounter: Payer: Self-pay | Admitting: Family Medicine

## 2014-08-29 ENCOUNTER — Ambulatory Visit (INDEPENDENT_AMBULATORY_CARE_PROVIDER_SITE_OTHER): Payer: BC Managed Care – PPO | Admitting: Family Medicine

## 2014-08-29 VITALS — BP 118/66 | HR 77 | Temp 98.0°F | Wt 266.3 lb

## 2014-08-29 DIAGNOSIS — O358XX2 Maternal care for other (suspected) fetal abnormality and damage, fetus 2: Secondary | ICD-10-CM

## 2014-08-29 DIAGNOSIS — O30043 Twin pregnancy, dichorionic/diamniotic, third trimester: Secondary | ICD-10-CM | POA: Diagnosis not present

## 2014-08-29 DIAGNOSIS — O24419 Gestational diabetes mellitus in pregnancy, unspecified control: Secondary | ICD-10-CM

## 2014-08-29 DIAGNOSIS — O4103X2 Oligohydramnios, third trimester, fetus 2: Secondary | ICD-10-CM

## 2014-08-29 DIAGNOSIS — O359XX2 Maternal care for (suspected) fetal abnormality and damage, unspecified, fetus 2: Secondary | ICD-10-CM

## 2014-08-29 LAB — POCT URINALYSIS DIP (DEVICE)
Bilirubin Urine: NEGATIVE
Glucose, UA: NEGATIVE mg/dL
HGB URINE DIPSTICK: NEGATIVE
Ketones, ur: NEGATIVE mg/dL
NITRITE: NEGATIVE
Protein, ur: 30 mg/dL — AB
Specific Gravity, Urine: 1.015 (ref 1.005–1.030)
UROBILINOGEN UA: 0.2 mg/dL (ref 0.0–1.0)
pH: 6.5 (ref 5.0–8.0)

## 2014-08-29 NOTE — Progress Notes (Signed)
NST reviewed and reactive x 2 FBS 67-99 2 hour pp 76-132 Per MFM recommend changing C-s to 37 wks--has u/s for growth scheduled Thursday--MFM to call MD after scan to change schedule if indicated.

## 2014-08-29 NOTE — Patient Instructions (Signed)
Breastfeeding Deciding to breastfeed is one of the best choices you can make for you and your baby. A change in hormones during pregnancy causes your breast tissue to grow and increases the number and size of your milk ducts. These hormones also allow proteins, sugars, and fats from your blood supply to make breast milk in your milk-producing glands. Hormones prevent breast milk from being released before your baby is born as well as prompt milk flow after birth. Once breastfeeding has begun, thoughts of your baby, as well as his or her sucking or crying, can stimulate the release of milk from your milk-producing glands.  BENEFITS OF BREASTFEEDING For Your Baby  Your first milk (colostrum) helps your baby's digestive system function better.   There are antibodies in your milk that help your baby fight off infections.   Your baby has a lower incidence of asthma, allergies, and sudden infant death syndrome.   The nutrients in breast milk are better for your baby than infant formulas and are designed uniquely for your baby's needs.   Breast milk improves your baby's brain development.   Your baby is less likely to develop other conditions, such as childhood obesity, asthma, or type 2 diabetes mellitus.  For You   Breastfeeding helps to create a very special bond between you and your baby.   Breastfeeding is convenient. Breast milk is always available at the correct temperature and costs nothing.   Breastfeeding helps to burn calories and helps you lose the weight gained during pregnancy.   Breastfeeding makes your uterus contract to its prepregnancy size faster and slows bleeding (lochia) after you give birth.   Breastfeeding helps to lower your risk of developing type 2 diabetes mellitus, osteoporosis, and breast or ovarian cancer later in life. SIGNS THAT YOUR BABY IS HUNGRY Early Signs of Hunger  Increased alertness or activity.  Stretching.  Movement of the head from  side to side.  Movement of the head and opening of the mouth when the corner of the mouth or cheek is stroked (rooting).  Increased sucking sounds, smacking lips, cooing, sighing, or squeaking.  Hand-to-mouth movements.  Increased sucking of fingers or hands. Late Signs of Hunger  Fussing.  Intermittent crying. Extreme Signs of Hunger Signs of extreme hunger will require calming and consoling before your baby will be able to breastfeed successfully. Do not wait for the following signs of extreme hunger to occur before you initiate breastfeeding:   Restlessness.  A loud, strong cry.   Screaming. BREASTFEEDING BASICS Breastfeeding Initiation  Find a comfortable place to sit or lie down, with your neck and back well supported.  Place a pillow or rolled up blanket under your baby to bring him or her to the level of your breast (if you are seated). Nursing pillows are specially designed to help support your arms and your baby while you breastfeed.  Make sure that your baby's abdomen is facing your abdomen.   Gently massage your breast. With your fingertips, massage from your chest wall toward your nipple in a circular motion. This encourages milk flow. You may need to continue this action during the feeding if your milk flows slowly.  Support your breast with 4 fingers underneath and your thumb above your nipple. Make sure your fingers are well away from your nipple and your baby's mouth.   Stroke your baby's lips gently with your finger or nipple.   When your baby's mouth is open wide enough, quickly bring your baby to your   breast, placing your entire nipple and as much of the colored area around your nipple (areola) as possible into your baby's mouth.   More areola should be visible above your baby's upper lip than below the lower lip.   Your baby's tongue should be between his or her lower gum and your breast.   Ensure that your baby's mouth is correctly positioned  around your nipple (latched). Your baby's lips should create a seal on your breast and be turned out (everted).  It is common for your baby to suck about 2-3 minutes in order to start the flow of breast milk. Latching Teaching your baby how to latch on to your breast properly is very important. An improper latch can cause nipple pain and decreased milk supply for you and poor weight gain in your baby. Also, if your baby is not latched onto your nipple properly, he or she may swallow some air during feeding. This can make your baby fussy. Burping your baby when you switch breasts during the feeding can help to get rid of the air. However, teaching your baby to latch on properly is still the best way to prevent fussiness from swallowing air while breastfeeding. Signs that your baby has successfully latched on to your nipple:    Silent tugging or silent sucking, without causing you pain.   Swallowing heard between every 3-4 sucks.    Muscle movement above and in front of his or her ears while sucking.  Signs that your baby has not successfully latched on to nipple:   Sucking sounds or smacking sounds from your baby while breastfeeding.  Nipple pain. If you think your baby has not latched on correctly, slip your finger into the corner of your baby's mouth to break the suction and place it between your baby's gums. Attempt breastfeeding initiation again. Signs of Successful Breastfeeding Signs from your baby:   A gradual decrease in the number of sucks or complete cessation of sucking.   Falling asleep.   Relaxation of his or her body.   Retention of a small amount of milk in his or her mouth.   Letting go of your breast by himself or herself. Signs from you:  Breasts that have increased in firmness, weight, and size 1-3 hours after feeding.   Breasts that are softer immediately after breastfeeding.  Increased milk volume, as well as a change in milk consistency and color by  the fifth day of breastfeeding.   Nipples that are not sore, cracked, or bleeding. Signs That Your Baby is Getting Enough Milk  Wetting at least 3 diapers in a 24-hour period. The urine should be clear and pale yellow by age 5 days.  At least 3 stools in a 24-hour period by age 5 days. The stool should be soft and yellow.  At least 3 stools in a 24-hour period by age 7 days. The stool should be seedy and yellow.  No loss of weight greater than 10% of birth weight during the first 3 days of age.  Average weight gain of 4-7 ounces (113-198 g) per week after age 4 days.  Consistent daily weight gain by age 5 days, without weight loss after the age of 2 weeks. After a feeding, your baby may spit up a small amount. This is common. BREASTFEEDING FREQUENCY AND DURATION Frequent feeding will help you make more milk and can prevent sore nipples and breast engorgement. Breastfeed when you feel the need to reduce the fullness of your breasts   or when your baby shows signs of hunger. This is called "breastfeeding on demand." Avoid introducing a pacifier to your baby while you are working to establish breastfeeding (the first 4-6 weeks after your baby is born). After this time you may choose to use a pacifier. Research has shown that pacifier use during the first year of a baby's life decreases the risk of sudden infant death syndrome (SIDS). Allow your baby to feed on each breast as long as he or she wants. Breastfeed until your baby is finished feeding. When your baby unlatches or falls asleep while feeding from the first breast, offer the second breast. Because newborns are often sleepy in the first few weeks of life, you may need to awaken your baby to get him or her to feed. Breastfeeding times will vary from baby to baby. However, the following rules can serve as a guide to help you ensure that your baby is properly fed:  Newborns (babies 4 weeks of age or younger) may breastfeed every 1-3  hours.  Newborns should not go longer than 3 hours during the day or 5 hours during the night without breastfeeding.  You should breastfeed your baby a minimum of 8 times in a 24-hour period until you begin to introduce solid foods to your baby at around 6 months of age. BREAST MILK PUMPING Pumping and storing breast milk allows you to ensure that your baby is exclusively fed your breast milk, even at times when you are unable to breastfeed. This is especially important if you are going back to work while you are still breastfeeding or when you are not able to be present during feedings. Your lactation consultant can give you guidelines on how long it is safe to store breast milk.  A breast pump is a machine that allows you to pump milk from your breast into a sterile bottle. The pumped breast milk can then be stored in a refrigerator or freezer. Some breast pumps are operated by hand, while others use electricity. Ask your lactation consultant which type will work best for you. Breast pumps can be purchased, but some hospitals and breastfeeding support groups lease breast pumps on a monthly basis. A lactation consultant can teach you how to hand express breast milk, if you prefer not to use a pump.  CARING FOR YOUR BREASTS WHILE YOU BREASTFEED Nipples can become dry, cracked, and sore while breastfeeding. The following recommendations can help keep your breasts moisturized and healthy:  Avoid using soap on your nipples.   Wear a supportive bra. Although not required, special nursing bras and tank tops are designed to allow access to your breasts for breastfeeding without taking off your entire bra or top. Avoid wearing underwire-style bras or extremely tight bras.  Air dry your nipples for 3-4minutes after each feeding.   Use only cotton bra pads to absorb leaked breast milk. Leaking of breast milk between feedings is normal.   Use lanolin on your nipples after breastfeeding. Lanolin helps to  maintain your skin's normal moisture barrier. If you use pure lanolin, you do not need to wash it off before feeding your baby again. Pure lanolin is not toxic to your baby. You may also hand express a few drops of breast milk and gently massage that milk into your nipples and allow the milk to air dry. In the first few weeks after giving birth, some women experience extremely full breasts (engorgement). Engorgement can make your breasts feel heavy, warm, and tender to the   touch. Engorgement peaks within 3-5 days after you give birth. The following recommendations can help ease engorgement:  Completely empty your breasts while breastfeeding or pumping. You may want to start by applying warm, moist heat (in the shower or with warm water-soaked hand towels) just before feeding or pumping. This increases circulation and helps the milk flow. If your baby does not completely empty your breasts while breastfeeding, pump any extra milk after he or she is finished.  Wear a snug bra (nursing or regular) or tank top for 1-2 days to signal your body to slightly decrease milk production.  Apply ice packs to your breasts, unless this is too uncomfortable for you.  Make sure that your baby is latched on and positioned properly while breastfeeding. If engorgement persists after 48 hours of following these recommendations, contact your health care provider or a lactation consultant. OVERALL HEALTH CARE RECOMMENDATIONS WHILE BREASTFEEDING  Eat healthy foods. Alternate between meals and snacks, eating 3 of each per day. Because what you eat affects your breast milk, some of the foods may make your baby more irritable than usual. Avoid eating these foods if you are sure that they are negatively affecting your baby.  Drink milk, fruit juice, and water to satisfy your thirst (about 10 glasses a day).   Rest often, relax, and continue to take your prenatal vitamins to prevent fatigue, stress, and anemia.  Continue  breast self-awareness checks.  Avoid chewing and smoking tobacco.  Avoid alcohol and drug use. Some medicines that may be harmful to your baby can pass through breast milk. It is important to ask your health care provider before taking any medicine, including all over-the-counter and prescription medicine as well as vitamin and herbal supplements. It is possible to become pregnant while breastfeeding. If birth control is desired, ask your health care provider about options that will be safe for your baby. SEEK MEDICAL CARE IF:   You feel like you want to stop breastfeeding or have become frustrated with breastfeeding.  You have painful breasts or nipples.  Your nipples are cracked or bleeding.  Your breasts are red, tender, or warm.  You have a swollen area on either breast.  You have a fever or chills.  You have nausea or vomiting.  You have drainage other than breast milk from your nipples.  Your breasts do not become full before feedings by the fifth day after you give birth.  You feel sad and depressed.  Your baby is too sleepy to eat well.  Your baby is having trouble sleeping.   Your baby is wetting less than 3 diapers in a 24-hour period.  Your baby has less than 3 stools in a 24-hour period.  Your baby's skin or the white part of his or her eyes becomes yellow.   Your baby is not gaining weight by 5 days of age. SEEK IMMEDIATE MEDICAL CARE IF:   Your baby is overly tired (lethargic) and does not want to wake up and feed.  Your baby develops an unexplained fever. Document Released: 06/10/2005 Document Revised: 06/15/2013 Document Reviewed: 12/02/2012 [employer] Patient Information 2015 [employer]. This information is not intended to replace advice given to you by your health care provider. Make sure you discuss any questions you have with your health care provider.  

## 2014-08-31 ENCOUNTER — Encounter (HOSPITAL_COMMUNITY)
Admission: AD | Disposition: A | Discharge status: Home / Self Care | Payer: Self-pay | Source: Ambulatory Visit | Attending: Obstetrics & Gynecology

## 2014-08-31 ENCOUNTER — Encounter (HOSPITAL_COMMUNITY): Payer: Self-pay | Admitting: Anesthesiology

## 2014-08-31 ENCOUNTER — Encounter (HOSPITAL_COMMUNITY): Payer: Self-pay

## 2014-08-31 ENCOUNTER — Inpatient Hospital Stay (HOSPITAL_COMMUNITY)
Admission: AD | Admit: 2014-08-31 | Discharge: 2014-09-04 | DRG: 765 | Disposition: A | Discharge status: Home / Self Care | Payer: BC Managed Care – PPO | Source: Ambulatory Visit | Attending: Obstetrics & Gynecology | Admitting: Obstetrics & Gynecology

## 2014-08-31 ENCOUNTER — Inpatient Hospital Stay (HOSPITAL_COMMUNITY): Payer: BC Managed Care – PPO | Admitting: Anesthesiology

## 2014-08-31 DIAGNOSIS — O24420 Gestational diabetes mellitus in childbirth, diet controlled: Secondary | ICD-10-CM | POA: Diagnosis present

## 2014-08-31 DIAGNOSIS — O42913 Preterm premature rupture of membranes, unspecified as to length of time between rupture and onset of labor, third trimester: Secondary | ICD-10-CM | POA: Diagnosis present

## 2014-08-31 DIAGNOSIS — Z3A36 36 weeks gestation of pregnancy: Secondary | ICD-10-CM | POA: Diagnosis present

## 2014-08-31 DIAGNOSIS — O30043 Twin pregnancy, dichorionic/diamniotic, third trimester: Secondary | ICD-10-CM | POA: Diagnosis present

## 2014-08-31 DIAGNOSIS — Z98891 History of uterine scar from previous surgery: Secondary | ICD-10-CM

## 2014-08-31 DIAGNOSIS — O09293 Supervision of pregnancy with other poor reproductive or obstetric history, third trimester: Secondary | ICD-10-CM

## 2014-08-31 DIAGNOSIS — O99214 Obesity complicating childbirth: Secondary | ICD-10-CM | POA: Diagnosis present

## 2014-08-31 DIAGNOSIS — O321XX1 Maternal care for breech presentation, fetus 1: Principal | ICD-10-CM | POA: Diagnosis present

## 2014-08-31 DIAGNOSIS — Z6841 Body Mass Index (BMI) 40.0 and over, adult: Secondary | ICD-10-CM

## 2014-08-31 DIAGNOSIS — Z881 Allergy status to other antibiotic agents status: Secondary | ICD-10-CM | POA: Diagnosis not present

## 2014-08-31 DIAGNOSIS — A6000 Herpesviral infection of urogenital system, unspecified: Secondary | ICD-10-CM | POA: Diagnosis present

## 2014-08-31 DIAGNOSIS — E669 Obesity, unspecified: Secondary | ICD-10-CM | POA: Diagnosis present

## 2014-08-31 DIAGNOSIS — O359XX2 Maternal care for (suspected) fetal abnormality and damage, unspecified, fetus 2: Secondary | ICD-10-CM | POA: Diagnosis present

## 2014-08-31 DIAGNOSIS — O321XX2 Maternal care for breech presentation, fetus 2: Secondary | ICD-10-CM | POA: Diagnosis not present

## 2014-08-31 DIAGNOSIS — O9832 Other infections with a predominantly sexual mode of transmission complicating childbirth: Secondary | ICD-10-CM | POA: Diagnosis present

## 2014-08-31 DIAGNOSIS — O4103X2 Oligohydramnios, third trimester, fetus 2: Secondary | ICD-10-CM | POA: Diagnosis present

## 2014-08-31 LAB — POCT FERN TEST: POCT FERN TEST: POSITIVE

## 2014-08-31 LAB — CBC
HCT: 31 % — ABNORMAL LOW (ref 36.0–46.0)
Hemoglobin: 10.7 g/dL — ABNORMAL LOW (ref 12.0–15.0)
MCH: 33.1 pg (ref 26.0–34.0)
MCHC: 34.5 g/dL (ref 30.0–36.0)
MCV: 96 fL (ref 78.0–100.0)
Platelets: 222 10*3/uL (ref 150–400)
RBC: 3.23 MIL/uL — ABNORMAL LOW (ref 3.87–5.11)
RDW: 12.9 % (ref 11.5–15.5)
WBC: 9.8 10*3/uL (ref 4.0–10.5)

## 2014-08-31 LAB — GLUCOSE, CAPILLARY: Glucose-Capillary: 142 mg/dL — ABNORMAL HIGH (ref 70–99)

## 2014-08-31 SURGERY — Surgical Case
Anesthesia: Spinal | Site: Abdomen

## 2014-08-31 MED ORDER — PHENYLEPHRINE 8 MG IN D5W 100 ML (0.08MG/ML) PREMIX OPTIME
INJECTION | INTRAVENOUS | Status: AC
  Filled 2014-08-31: qty 100

## 2014-08-31 MED ORDER — FENTANYL CITRATE 0.05 MG/ML IJ SOLN
INTRAMUSCULAR | Status: AC
  Filled 2014-08-31: qty 2

## 2014-08-31 MED ORDER — DEXTROSE 5 % IV SOLN
3.0000 g | INTRAVENOUS | Status: AC
  Administered 2014-08-31: 3 g via INTRAVENOUS
  Filled 2014-08-31: qty 3000

## 2014-08-31 MED ORDER — BUPIVACAINE LIPOSOME 1.3 % IJ SUSP
20.0000 mL | Freq: Once | INTRAMUSCULAR | Status: AC
  Administered 2014-09-01: 20 mL
  Filled 2014-08-31: qty 20

## 2014-08-31 MED ORDER — ONDANSETRON HCL 4 MG/2ML IJ SOLN
INTRAMUSCULAR | Status: AC
  Filled 2014-08-31: qty 2

## 2014-08-31 MED ORDER — MORPHINE SULFATE 0.5 MG/ML IJ SOLN
INTRAMUSCULAR | Status: AC
  Filled 2014-08-31: qty 10

## 2014-08-31 MED ORDER — FAMOTIDINE IN NACL 20-0.9 MG/50ML-% IV SOLN
20.0000 mg | Freq: Once | INTRAVENOUS | Status: AC
  Administered 2014-08-31: 20 mg via INTRAVENOUS
  Filled 2014-08-31: qty 50

## 2014-08-31 MED ORDER — LACTATED RINGERS IV SOLN
INTRAVENOUS | Status: DC
  Administered 2014-08-31 – 2014-09-01 (×2): via INTRAVENOUS

## 2014-08-31 MED ORDER — LACTATED RINGERS IV BOLUS (SEPSIS): 1000.0000 mL | Freq: Once | INTRAVENOUS | Status: DC

## 2014-08-31 MED ORDER — OXYTOCIN 10 UNIT/ML IJ SOLN
INTRAMUSCULAR | Status: AC
  Filled 2014-08-31: qty 4

## 2014-08-31 MED ORDER — CITRIC ACID-SODIUM CITRATE 334-500 MG/5ML PO SOLN
30.0000 mL | Freq: Once | ORAL | Status: AC
  Administered 2014-08-31: 30 mL via ORAL
  Filled 2014-08-31: qty 15

## 2014-08-31 SURGICAL SUPPLY — 35 items
CLAMP CORD UMBIL (MISCELLANEOUS) ×2 IMPLANT
CLOTH BEACON ORANGE TIMEOUT ST (SAFETY) ×2 IMPLANT
DRAPE SHEET LG 3/4 BI-LAMINATE (DRAPES) IMPLANT
DRSG OPSITE POSTOP 4X10 (GAUZE/BANDAGES/DRESSINGS) ×2 IMPLANT
DURAPREP 26ML APPLICATOR (WOUND CARE) ×4 IMPLANT
ELECT REM PT RETURN 9FT ADLT (ELECTROSURGICAL) ×2
ELECTRODE REM PT RTRN 9FT ADLT (ELECTROSURGICAL) ×1 IMPLANT
EXTRACTOR VACUUM BELL STYLE (SUCTIONS) IMPLANT
GLOVE BIOGEL PI IND STRL 8 (GLOVE) ×1 IMPLANT
GLOVE BIOGEL PI INDICATOR 8 (GLOVE) ×1
GLOVE ECLIPSE 8.0 STRL XLNG CF (GLOVE) ×2 IMPLANT
GOWN STRL REUS W/TWL LRG LVL3 (GOWN DISPOSABLE) ×4 IMPLANT
KIT ABG SYR 3ML LUER SLIP (SYRINGE) ×4 IMPLANT
LIQUID BAND (GAUZE/BANDAGES/DRESSINGS) ×2 IMPLANT
NEEDLE HYPO 18GX1.5 BLUNT FILL (NEEDLE) ×2 IMPLANT
NEEDLE HYPO 22GX1.5 SAFETY (NEEDLE) ×2 IMPLANT
NEEDLE HYPO 25X5/8 SAFETYGLIDE (NEEDLE) ×4 IMPLANT
NS IRRIG 1000ML POUR BTL (IV SOLUTION) ×2 IMPLANT
PACK C SECTION WH (CUSTOM PROCEDURE TRAY) ×2 IMPLANT
PAD OB MATERNITY 4.3X12.25 (PERSONAL CARE ITEMS) ×2 IMPLANT
RTRCTR C-SECT PINK 25CM LRG (MISCELLANEOUS) IMPLANT
STAPLER VISISTAT 35W (STAPLE) IMPLANT
SUT CHROMIC 0 CT 1 (SUTURE) ×2 IMPLANT
SUT MNCRL 0 VIOLET CTX 36 (SUTURE) ×2 IMPLANT
SUT MONOCRYL 0 CTX 36 (SUTURE) ×2
SUT PLAIN 2 0 (SUTURE)
SUT PLAIN 2 0 XLH (SUTURE) ×2 IMPLANT
SUT PLAIN ABS 2-0 CT1 27XMFL (SUTURE) IMPLANT
SUT VIC AB 0 CTX 36 (SUTURE) ×1
SUT VIC AB 0 CTX36XBRD ANBCTRL (SUTURE) ×1 IMPLANT
SUT VIC AB 4-0 KS 27 (SUTURE) ×2 IMPLANT
SYR 20CC LL (SYRINGE) ×4 IMPLANT
SYRINGE 10CC LL (SYRINGE) ×2 IMPLANT
TOWEL OR 17X24 6PK STRL BLUE (TOWEL DISPOSABLE) ×2 IMPLANT
TRAY FOLEY CATH 14FR (SET/KITS/TRAYS/PACK) IMPLANT

## 2014-08-31 NOTE — Anesthesia Preprocedure Evaluation (Signed)
Anesthesia Evaluation  Patient identified by MRN, date of birth, ID band Patient awake    Reviewed: Allergy & Precautions, NPO status , Patient's Chart, lab work & pertinent test results  History of Anesthesia Complications Negative for: history of anesthetic complications  Airway Mallampati: III  TM Distance: >3 FB Neck ROM: Full    Dental no notable dental hx. (+) Dental Advisory Given   Pulmonary asthma ,  breath sounds clear to auscultation  Pulmonary exam normal       Cardiovascular negative cardio ROS  Rhythm:Regular Rate:Normal     Neuro/Psych negative neurological ROS  negative psych ROS   GI/Hepatic negative GI ROS, Neg liver ROS,   Endo/Other  diabetes, Gestational, Oral Hypoglycemic AgentsMorbid obesity  Renal/GU negative Renal ROS  negative genitourinary   Musculoskeletal negative musculoskeletal ROS (+)   Abdominal   Peds negative pediatric ROS (+)  Hematology negative hematology ROS (+)   Anesthesia Other Findings   Reproductive/Obstetrics (+) Pregnancy                             Anesthesia Physical Anesthesia Plan  ASA: III and emergent  Anesthesia Plan: Spinal   Post-op Pain Management:    Induction:   Airway Management Planned:   Additional Equipment:   Intra-op Plan:   Post-operative Plan:   Informed Consent: I have reviewed the patients History and Physical, chart, labs and discussed the procedure including the risks, benefits and alternatives for the proposed anesthesia with the patient or authorized representative who has indicated his/her understanding and acceptance.   Dental advisory given  Plan Discussed with:   Anesthesia Plan Comments: (Patient ate pizza at 7pm. Discussed with Dr. Despina HiddenEure and due to her continuous and frequent contractions and breech presentation, he reports we can't wait a full 8 hours for NPO and will proceed with the case.  Plan for regional anesthetic. Will give pepcid and bicitra. )        Anesthesia Quick Evaluation

## 2014-08-31 NOTE — MAU Note (Signed)
Leaking fluid since around 9 pm, clear with bloody. Some low back pain that comes & goes. Positive fetal movement.

## 2014-08-31 NOTE — Anesthesia Preprocedure Evaluation (Signed)
Anesthesia Evaluation  Patient identified by MRN, date of birth, ID band Patient awake    Reviewed: Allergy & Precautions, NPO status , Patient's Chart, lab work & pertinent test results  History of Anesthesia Complications Negative for: history of anesthetic complications  Airway Mallampati: III  TM Distance: >3 FB Neck ROM: Full    Dental no notable dental hx. (+) Dental Advisory Given   Pulmonary asthma ,  breath sounds clear to auscultation  Pulmonary exam normal       Cardiovascular negative cardio ROS  Rhythm:Regular Rate:Normal     Neuro/Psych negative neurological ROS  negative psych ROS   GI/Hepatic negative GI ROS, Neg liver ROS,   Endo/Other  diabetes, GestationalMorbid obesity  Renal/GU negative Renal ROS  negative genitourinary   Musculoskeletal negative musculoskeletal ROS (+)   Abdominal   Peds negative pediatric ROS (+)  Hematology negative hematology ROS (+)   Anesthesia Other Findings   Reproductive/Obstetrics (+) Pregnancy                             Anesthesia Physical Anesthesia Plan  ASA: III  Anesthesia Plan: Spinal   Post-op Pain Management:    Induction:   Airway Management Planned:   Additional Equipment:   Intra-op Plan:   Post-operative Plan:   Informed Consent: I have reviewed the patients History and Physical, chart, labs and discussed the procedure including the risks, benefits and alternatives for the proposed anesthesia with the patient or authorized representative who has indicated his/her understanding and acceptance.   Dental advisory given  Plan Discussed with:   Anesthesia Plan Comments: (Patient ate at 7pm, pizza. Dr. Despina HiddenEure aware and due to contraction interval and dilation, reports that we must proceed prior to NPO status being 8 hours. Will give patient pepcid and bicitra and proceed with neuraxial anesthetic)         Anesthesia Quick Evaluation

## 2014-08-31 NOTE — H&P (Addendum)
Preoperative History and Physical  Krystal Nguyen is a 24 y.o. G2P0010 with Patient's last menstrual period was 12/22/2013. Estimated Date of Delivery: 09/28/14 twins [redacted]w[redacted]d admitted for a primary Caesarean section due to Twin A breech and ROM.  Having contractions q3 minutes cervix 2/60/-2 breech   PMH:    Past Medical History  Diagnosis Date  . Miscarriage   . Obesity   . Asthma     childhood  . Gestational diabetes   . Genital herpes     PSH:     Past Surgical History  Procedure Laterality Date  . No past surgeries      POb/GynH:      OB History    Gravida Para Term Preterm AB TAB SAB Ectopic Multiple Living   SH:   History  Substance Use Topics  . Smoking status: Never Smoker   . Smokeless tobacco: Never Used  . Alcohol Use: No     Comment: social    FH:    Family History  Problem Relation Age of Onset  . Diabetes Maternal Grandmother   . Hypertension Maternal Grandmother   . Cancer Maternal Grandfather   . Diabetes Father      Allergies:  Allergies  Allergen Reactions  . Bactrim [Sulfamethoxazole-Trimethoprim] Hives and Itching    Medications:      No current facility-administered medications for this encounter.  Review of Systems:   Review of Systems  Constitutional: Negative for fever, chills, weight loss, malaise/fatigue and diaphoresis.  HENT: Negative for hearing loss, ear pain, nosebleeds, congestion, sore throat, neck pain, tinnitus and ear discharge.   Eyes: Negative for blurred vision, double vision, photophobia, pain, discharge and redness.  Respiratory: Negative for cough, hemoptysis, sputum production, shortness of breath, wheezing and stridor.   Cardiovascular: Negative for chest pain, palpitations, orthopnea, claudication, leg swelling and PND.  Gastrointestinal: Positive for abdominal pain. Negative for heartburn, nausea, vomiting, diarrhea, constipation, blood in stool and melena.  Genitourinary: Negative for  dysuria, urgency, frequency, hematuria and flank pain.  Musculoskeletal: Negative for myalgias, back pain, joint pain and falls.  Skin: Negative for itching and rash.  Neurological: Negative for dizziness, tingling, tremors, sensory change, speech change, focal weakness, seizures, loss of consciousness, weakness and headaches.  Endo/Heme/Allergies: Negative for environmental allergies and polydipsia. Does not bruise/bleed easily.  Psychiatric/Behavioral: Negative for depression, suicidal ideas, hallucinations, memory loss and substance abuse. The patient is not nervous/anxious and does not have insomnia.      PHYSICAL EXAM:  Temperature 98.5 F (36.9 C), temperature source Oral, resp. rate 18, last menstrual period 12/22/2013.    Vitals reviewed. Constitutional: She is oriented to person, place, and time. She appears well-developed and well-nourished.  HENT:  Head: Normocephalic and atraumatic.  Right Ear: External ear normal.  Left Ear: External ear normal.  Nose: Nose normal.  Mouth/Throat: Oropharynx is clear and moist.  Eyes: Conjunctivae and EOM are normal. Pupils are equal, round, and reactive to light. Right eye exhibits no discharge. Left eye exhibits no discharge. No scleral icterus.  Neck: Normal range of motion. Neck supple. No tracheal deviation present. No thyromegaly present.  Cardiovascular: Normal rate, regular rhythm, normal heart sounds and intact distal pulses.  Exam reveals no gallop and no friction rub.   No murmur heard. Respiratory: Effort normal and breath sounds normal. No respiratory distress. She has no wheezes. She has no rales. She exhibits no tenderness.  GI:  Soft. Bowel sounds are normal. She exhibits no distension and no mass. There is tenderness. There is no rebound and no guarding.  Genitourinary:       Vulva is normal without lesions Vagina is pink moist without discharge Cervix 2/60/-2 Uterus is enlarged  Twin gestation Adnexa is negative with  normal sized ovaries by sonogram  Musculoskeletal: Normal range of motion. She exhibits no edema and no tenderness.  Neurological: She is alert and oriented to person, place, and time. She has normal reflexes. She displays normal reflexes. No cranial nerve deficit. She exhibits normal muscle tone. Coordination normal.  Skin: Skin is warm and dry. No rash noted. No erythema. No pallor.  Psychiatric: She has a normal mood and affect. Her behavior is normal. Judgment and thought content normal.    Labs: Results for orders placed or performed during the hospital encounter of 08/31/14 (from the past 336 hour(s))  Fern Test   Collection Time: 08/31/14 10:11 PM  Result Value Ref Range   POCT Fern Test Positive = ruptured amniotic membanes   Results for orders placed or performed in visit on 08/29/14 (from the past 336 hour(s))  OB RESULT CONSOLE Group B Strep   Collection Time: 08/22/14 12:00 AM  Result Value Ref Range   GBS Positive   OB RESULTS CONSOLE GC/Chlamydia   Collection Time: 08/22/14 12:00 AM  Result Value Ref Range   Gonorrhea Negative    Chlamydia Negative   POCT urinalysis dip (device)   Collection Time: 08/29/14  7:41 AM  Result Value Ref Range   Glucose, UA NEGATIVE NEGATIVE mg/dL   Bilirubin Urine NEGATIVE NEGATIVE   Ketones, ur NEGATIVE NEGATIVE mg/dL   Specific Gravity, Urine 1.015 1.005 - 1.030   Hgb urine dipstick NEGATIVE NEGATIVE   pH 6.5 5.0 - 8.0   Protein, ur 30 (A) NEGATIVE mg/dL   Urobilinogen, UA 0.2 0.0 - 1.0 mg/dL   Nitrite NEGATIVE NEGATIVE   Leukocytes, UA SMALL (A) NEGATIVE  Results for orders placed or performed during the hospital encounter of 08/24/14 (from the past 336 hour(s))  Urinalysis, Routine w reflex microscopic   Collection Time: 08/24/14 12:05 PM  Result Value Ref Range   Color, Urine YELLOW YELLOW   APPearance HAZY (A) CLEAR   Specific Gravity, Urine 1.020 1.005 - 1.030   pH 7.0 5.0 - 8.0   Glucose, UA NEGATIVE NEGATIVE mg/dL   Hgb  urine dipstick NEGATIVE NEGATIVE   Bilirubin Urine NEGATIVE NEGATIVE   Ketones, ur NEGATIVE NEGATIVE mg/dL   Protein, ur NEGATIVE NEGATIVE mg/dL   Urobilinogen, UA 0.2 0.0 - 1.0 mg/dL   Nitrite NEGATIVE NEGATIVE   Leukocytes, UA MODERATE (A) NEGATIVE  Protein / creatinine ratio, urine   Collection Time: 08/24/14 12:05 PM  Result Value Ref Range   Creatinine, Urine 79.00 mg/dL   Total Protein, Urine 38 mg/dL   Protein Creatinine Ratio 0.48 (H) 0.00 - 0.15  Urine microscopic-add on   Collection Time: 08/24/14 12:05 PM  Result Value Ref Range   Squamous Epithelial / LPF MANY (A) RARE   WBC, UA 3-6 <3 WBC/hpf   RBC / HPF 3-6 <3 RBC/hpf   Bacteria, UA MANY (A) RARE  CBC   Collection Time: 08/24/14 12:54 PM  Result Value Ref Range   WBC 7.4 4.0 - 10.5 K/uL   RBC 3.11 (L) 3.87 - 5.11 MIL/uL   Hemoglobin 10.3 (L) 12.0 - 15.0 g/dL   HCT 69.629.8 (L) 29.536.0 - 28.446.0 %   MCV 95.8  78.0 - 100.0 fL   MCH 33.1 26.0 - 34.0 pg   MCHC 34.6 30.0 - 36.0 g/dL   RDW 09.8 11.9 - 14.7 %   Platelets 224 150 - 400 K/uL  Comprehensive metabolic panel   Collection Time: 08/24/14 12:54 PM  Result Value Ref Range   Sodium 136 135 - 145 mmol/L   Potassium 3.5 3.5 - 5.1 mmol/L   Chloride 108 96 - 112 mmol/L   CO2 20 19 - 32 mmol/L   Glucose, Bld 110 (H) 70 - 99 mg/dL   BUN 8 6 - 23 mg/dL   Creatinine, Ser 8.29 0.50 - 1.10 mg/dL   Calcium 8.3 (L) 8.4 - 10.5 mg/dL   Total Protein 6.7 6.0 - 8.3 g/dL   Albumin 2.6 (L) 3.5 - 5.2 g/dL   AST 15 0 - 37 U/L   ALT 7 0 - 35 U/L   Alkaline Phosphatase 115 39 - 117 U/L   Total Bilirubin 0.4 0.3 - 1.2 mg/dL   GFR calc non Af Amer >90 >90 mL/min   GFR calc Af Amer >90 >90 mL/min   Anion gap 8 5 - 15  Results for orders placed or performed in visit on 08/22/14 (from the past 336 hour(s))  POCT urinalysis dip (device)   Collection Time: 08/22/14  7:46 AM  Result Value Ref Range   Glucose, UA NEGATIVE NEGATIVE mg/dL   Bilirubin Urine NEGATIVE NEGATIVE   Ketones, ur  NEGATIVE NEGATIVE mg/dL   Specific Gravity, Urine 1.020 1.005 - 1.030   Hgb urine dipstick NEGATIVE NEGATIVE   pH 7.0 5.0 - 8.0   Protein, ur 100 (A) NEGATIVE mg/dL   Urobilinogen, UA 0.2 0.0 - 1.0 mg/dL   Nitrite NEGATIVE NEGATIVE   Leukocytes, UA SMALL (A) NEGATIVE  Wet prep, genital   Collection Time: 08/22/14  9:10 AM  Result Value Ref Range   Yeast Wet Prep HPF POC NONE SEEN NONE SEEN   Trich, Wet Prep NONE SEEN NONE SEEN   Clue Cells Wet Prep HPF POC NONE SEEN NONE SEEN   WBC, Wet Prep HPF POC FEW NONE SEEN  Culture, beta strep (group b only)   Collection Time: 08/22/14  9:10 AM  Result Value Ref Range   Organism ID, Bacteria GROUP B STREP (S.[email or web address]) ISOLATED   Results for orders placed or performed in visit on 08/22/14 (from the past 336 hour(s))  GC/Chlamydia Probe Amp   Collection Time: 08/22/14  9:10 AM  Result Value Ref Range   CT Probe RNA NEGATIVE    GC Probe RNA NEGATIVE     EKG: Orders placed or performed during the hospital encounter of 07/12/14  . EKG 12-Lead  . EKG 12-Lead  . EKG 12-Lead  . 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 Dichorionic diamniotic twin pregnancy in third trimester   . Oligohydramnios for Twin B, antepartum   . Gestational diabetes mellitus, antepartum 07/11/2014  . Dichorionic diamniotic twin pregnancy in second trimester   . 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 Fetal renal anomaly for Twin B     Plan: Primary Caesarean section  Lazaro Arms 08/31/2014 10:48 PM     Prenatal Transfer Tool  Maternal Diabetes: Yes:  Diabetes  Type:  Diet controlled Genetic Screening: Normal Maternal Ultrasounds/Referrals: abnormal : as above, discussed with Dr Loren Racer B Fetal Ultrasounds or other Referrals:  Referred to Materal Fetal Medicine  Maternal Substance Abuse:  No Significant Maternal Medications:  None Significant Maternal Lab Results: None

## 2014-09-01 ENCOUNTER — Ambulatory Visit (HOSPITAL_COMMUNITY): Payer: BC Managed Care – PPO

## 2014-09-01 ENCOUNTER — Encounter (HOSPITAL_COMMUNITY): Payer: Self-pay | Admitting: *Deleted

## 2014-09-01 DIAGNOSIS — O4103X2 Oligohydramnios, third trimester, fetus 2: Secondary | ICD-10-CM

## 2014-09-01 DIAGNOSIS — O321XX1 Maternal care for breech presentation, fetus 1: Secondary | ICD-10-CM

## 2014-09-01 DIAGNOSIS — O30043 Twin pregnancy, dichorionic/diamniotic, third trimester: Secondary | ICD-10-CM

## 2014-09-01 DIAGNOSIS — Z98891 History of uterine scar from previous surgery: Secondary | ICD-10-CM

## 2014-09-01 DIAGNOSIS — O321XX2 Maternal care for breech presentation, fetus 2: Secondary | ICD-10-CM

## 2014-09-01 DIAGNOSIS — Z3A36 36 weeks gestation of pregnancy: Secondary | ICD-10-CM

## 2014-09-01 LAB — GLUCOSE, CAPILLARY: GLUCOSE-CAPILLARY: 113 mg/dL — AB (ref 70–99)

## 2014-09-01 LAB — RPR: RPR Ser Ql: NONREACTIVE

## 2014-09-01 LAB — CBC
HEMATOCRIT: 26.6 % — AB (ref 36.0–46.0)
HEMOGLOBIN: 9.1 g/dL — AB (ref 12.0–15.0)
MCH: 33 pg (ref 26.0–34.0)
MCHC: 34.2 g/dL (ref 30.0–36.0)
MCV: 96.4 fL (ref 78.0–100.0)
PLATELETS: 182 10*3/uL (ref 150–400)
RBC: 2.76 MIL/uL — ABNORMAL LOW (ref 3.87–5.11)
RDW: 13 % (ref 11.5–15.5)
WBC: 10 10*3/uL (ref 4.0–10.5)

## 2014-09-01 LAB — ABO/RH: ABO/RH(D): O POS

## 2014-09-01 LAB — TYPE AND SCREEN
ABO/RH(D): O POS
Antibody Screen: NEGATIVE

## 2014-09-01 MED ORDER — ONDANSETRON HCL 4 MG/2ML IJ SOLN: 4.0000 mg | Freq: Once | INTRAMUSCULAR | Status: DC | PRN

## 2014-09-01 MED ORDER — LANOLIN HYDROUS EX OINT: 1.0000 "application " | TOPICAL_OINTMENT | CUTANEOUS | Status: DC | PRN

## 2014-09-01 MED ORDER — NALBUPHINE HCL 10 MG/ML IJ SOLN: 5.0000 mg | Freq: Once | INTRAMUSCULAR | Status: AC | PRN

## 2014-09-01 MED ORDER — METHYLERGONOVINE MALEATE 0.2 MG PO TABS: 0.2000 mg | ORAL_TABLET | ORAL | Status: DC | PRN

## 2014-09-01 MED ORDER — SCOPOLAMINE 1 MG/3DAYS TD PT72
MEDICATED_PATCH | TRANSDERMAL | Status: AC
  Filled 2014-09-01: qty 1

## 2014-09-01 MED ORDER — IBUPROFEN 600 MG PO TABS
600.0000 mg | ORAL_TABLET | Freq: Four times a day (QID) | ORAL | Status: DC
  Administered 2014-09-01 – 2014-09-04 (×12): 600 mg via ORAL
  Filled 2014-09-01 (×14): qty 1

## 2014-09-01 MED ORDER — SIMETHICONE 80 MG PO CHEW
80.0000 mg | CHEWABLE_TABLET | ORAL | Status: DC
  Administered 2014-09-02 – 2014-09-03 (×4): 80 mg via ORAL
  Filled 2014-09-01 (×3): qty 1

## 2014-09-01 MED ORDER — KETOROLAC TROMETHAMINE 30 MG/ML IJ SOLN
30.0000 mg | Freq: Four times a day (QID) | INTRAMUSCULAR | Status: AC | PRN
  Administered 2014-09-01: 30 mg via INTRAMUSCULAR

## 2014-09-01 MED ORDER — SIMETHICONE 80 MG PO CHEW
80.0000 mg | CHEWABLE_TABLET | Freq: Three times a day (TID) | ORAL | Status: DC
  Administered 2014-09-01 – 2014-09-04 (×7): 80 mg via ORAL
  Filled 2014-09-01 (×9): qty 1

## 2014-09-01 MED ORDER — ONDANSETRON HCL 4 MG/2ML IJ SOLN
INTRAMUSCULAR | Status: DC | PRN
  Administered 2014-09-01: 4 mg via INTRAVENOUS

## 2014-09-01 MED ORDER — ZOLPIDEM TARTRATE 5 MG PO TABS
5.0000 mg | ORAL_TABLET | Freq: Every evening | ORAL | Status: DC | PRN
  Administered 2014-09-03: 5 mg via ORAL
  Filled 2014-09-01: qty 1

## 2014-09-01 MED ORDER — METHYLERGONOVINE MALEATE 0.2 MG/ML IJ SOLN: 0.2000 mg | INTRAMUSCULAR | Status: DC | PRN

## 2014-09-01 MED ORDER — WITCH HAZEL-GLYCERIN EX PADS: 1.0000 "application " | MEDICATED_PAD | CUTANEOUS | Status: DC | PRN

## 2014-09-01 MED ORDER — OXYTOCIN 10 UNIT/ML IJ SOLN
40.0000 [IU] | INTRAMUSCULAR | Status: DC | PRN
  Administered 2014-09-01: 40 [IU] via INTRAVENOUS

## 2014-09-01 MED ORDER — SIMETHICONE 80 MG PO CHEW: 80.0000 mg | CHEWABLE_TABLET | ORAL | Status: DC | PRN

## 2014-09-01 MED ORDER — FENTANYL CITRATE 0.05 MG/ML IJ SOLN
25.0000 ug | INTRAMUSCULAR | Status: DC | PRN
  Administered 2014-09-01 (×2): 50 ug via INTRAVENOUS

## 2014-09-01 MED ORDER — ONDANSETRON HCL 4 MG PO TABS: 4.0000 mg | ORAL_TABLET | ORAL | Status: DC | PRN

## 2014-09-01 MED ORDER — NALBUPHINE HCL 10 MG/ML IJ SOLN: 5.0000 mg | INTRAMUSCULAR | Status: DC | PRN

## 2014-09-01 MED ORDER — ONDANSETRON HCL 4 MG/2ML IJ SOLN: 4.0000 mg | INTRAMUSCULAR | Status: DC | PRN

## 2014-09-01 MED ORDER — MENTHOL 3 MG MT LOZG: 1.0000 | LOZENGE | OROMUCOSAL | Status: DC | PRN

## 2014-09-01 MED ORDER — LACTATED RINGERS IV SOLN
INTRAVENOUS | Status: DC
  Administered 2014-09-01: 1000 mL via INTRAVENOUS

## 2014-09-01 MED ORDER — PHENYLEPHRINE 8 MG IN D5W 100 ML (0.08MG/ML) PREMIX OPTIME
INJECTION | INTRAVENOUS | Status: DC | PRN
  Administered 2014-08-31: 60 ug/min via INTRAVENOUS

## 2014-09-01 MED ORDER — SENNOSIDES-DOCUSATE SODIUM 8.6-50 MG PO TABS
2.0000 | ORAL_TABLET | ORAL | Status: DC
  Administered 2014-09-02 – 2014-09-03 (×3): 2 via ORAL
  Filled 2014-09-01 (×3): qty 2

## 2014-09-01 MED ORDER — PRENATAL MULTIVITAMIN CH
1.0000 | ORAL_TABLET | Freq: Every day | ORAL | Status: DC
  Administered 2014-09-01 – 2014-09-03 (×3): 1 via ORAL
  Filled 2014-09-01 (×3): qty 1

## 2014-09-01 MED ORDER — DIPHENHYDRAMINE HCL 25 MG PO CAPS: 25.0000 mg | ORAL_CAPSULE | ORAL | Status: DC | PRN

## 2014-09-01 MED ORDER — OXYTOCIN 40 UNITS IN LACTATED RINGERS INFUSION - SIMPLE MED: 62.5000 mL/h | INTRAVENOUS | Status: AC

## 2014-09-01 MED ORDER — SUCCINYLCHOLINE CHLORIDE 20 MG/ML IJ SOLN
INTRAMUSCULAR | Status: AC
  Filled 2014-09-01: qty 1

## 2014-09-01 MED ORDER — ACETAMINOPHEN 500 MG PO TABS
1000.0000 mg | ORAL_TABLET | Freq: Four times a day (QID) | ORAL | Status: AC
  Administered 2014-09-01 – 2014-09-02 (×3): 1000 mg via ORAL
  Filled 2014-09-01 (×4): qty 2

## 2014-09-01 MED ORDER — LACTATED RINGERS IV SOLN
INTRAVENOUS | Status: DC | PRN
  Administered 2014-09-01: via INTRAVENOUS

## 2014-09-01 MED ORDER — DIPHENHYDRAMINE HCL 25 MG PO CAPS: 25.0000 mg | ORAL_CAPSULE | Freq: Four times a day (QID) | ORAL | Status: DC | PRN

## 2014-09-01 MED ORDER — NALOXONE HCL 1 MG/ML IJ SOLN
1.0000 ug/kg/h | INTRAVENOUS | Status: DC | PRN
  Filled 2014-09-01: qty 2

## 2014-09-01 MED ORDER — SCOPOLAMINE 1 MG/3DAYS TD PT72
1.0000 | MEDICATED_PATCH | Freq: Once | TRANSDERMAL | Status: DC
  Filled 2014-09-01: qty 1

## 2014-09-01 MED ORDER — KETOROLAC TROMETHAMINE 30 MG/ML IJ SOLN
INTRAMUSCULAR | Status: AC
  Filled 2014-09-01: qty 1

## 2014-09-01 MED ORDER — OXYCODONE-ACETAMINOPHEN 5-325 MG PO TABS
2.0000 | ORAL_TABLET | ORAL | Status: DC | PRN
  Administered 2014-09-02 – 2014-09-04 (×3): 2 via ORAL
  Filled 2014-09-01 (×3): qty 2

## 2014-09-01 MED ORDER — DIPHENHYDRAMINE HCL 50 MG/ML IJ SOLN
12.5000 mg | INTRAMUSCULAR | Status: DC | PRN
  Administered 2014-09-01: 12.5 mg via INTRAVENOUS
  Filled 2014-09-01: qty 1

## 2014-09-01 MED ORDER — TETANUS-DIPHTH-ACELL PERTUSSIS 5-2.5-18.5 LF-MCG/0.5 IM SUSP: 0.5000 mL | Freq: Once | INTRAMUSCULAR | Status: DC

## 2014-09-01 MED ORDER — BUPIVACAINE LIPOSOME 1.3 % IJ SUSP: 20.0000 mL | Freq: Once | INTRAMUSCULAR | Status: DC

## 2014-09-01 MED ORDER — MORPHINE SULFATE (PF) 0.5 MG/ML IJ SOLN
INTRAMUSCULAR | Status: DC | PRN
  Administered 2014-08-31: .2 mg via INTRATHECAL

## 2014-09-01 MED ORDER — SODIUM CHLORIDE 0.9 % IJ SOLN: 3.0000 mL | INTRAMUSCULAR | Status: DC | PRN

## 2014-09-01 MED ORDER — KETOROLAC TROMETHAMINE 30 MG/ML IJ SOLN: 30.0000 mg | Freq: Four times a day (QID) | INTRAMUSCULAR | Status: AC | PRN

## 2014-09-01 MED ORDER — ONDANSETRON HCL 4 MG/2ML IJ SOLN
4.0000 mg | Freq: Three times a day (TID) | INTRAMUSCULAR | Status: DC | PRN
Start: 2014-09-01 — End: 2014-09-04

## 2014-09-01 MED ORDER — OXYCODONE-ACETAMINOPHEN 5-325 MG PO TABS
1.0000 | ORAL_TABLET | ORAL | Status: DC | PRN
Start: 2014-09-01 — End: 2014-09-04
  Administered 2014-09-02 – 2014-09-03 (×4): 1 via ORAL
  Filled 2014-09-01 (×4): qty 1

## 2014-09-01 MED ORDER — SCOPOLAMINE 1 MG/3DAYS TD PT72
MEDICATED_PATCH | TRANSDERMAL | Status: DC | PRN
  Administered 2014-09-01: 1 via TRANSDERMAL

## 2014-09-01 MED ORDER — MEPERIDINE HCL 25 MG/ML IJ SOLN: 6.2500 mg | INTRAMUSCULAR | Status: DC | PRN

## 2014-09-01 MED ORDER — NALOXONE HCL 0.4 MG/ML IJ SOLN
0.4000 mg | INTRAMUSCULAR | Status: DC | PRN
Start: 2014-09-01 — End: 2014-09-04

## 2014-09-01 MED ORDER — FENTANYL CITRATE 0.05 MG/ML IJ SOLN
INTRAMUSCULAR | Status: AC
  Filled 2014-09-01: qty 2

## 2014-09-01 MED ORDER — FENTANYL CITRATE 0.05 MG/ML IJ SOLN
INTRAMUSCULAR | Status: DC | PRN
  Administered 2014-08-31: 10 ug via INTRATHECAL

## 2014-09-01 MED ORDER — BUPIVACAINE IN DEXTROSE 0.75-8.25 % IT SOLN
INTRATHECAL | Status: DC | PRN
  Administered 2014-08-31: 12.5 mg via INTRATHECAL

## 2014-09-01 MED ORDER — DIBUCAINE 1 % RE OINT: 1.0000 "application " | TOPICAL_OINTMENT | RECTAL | Status: DC | PRN

## 2014-09-01 MED ORDER — DEXTROSE 5 % IV SOLN: 3.0000 g | INTRAVENOUS | Status: DC

## 2014-09-01 NOTE — Progress Notes (Signed)
CSW met with parents and PGM to provide information regarding cremation and burial.  CSW agrees to return tomorrow to follow up and continue this discussion.  MOB appears to be coping as well as to be expected.  She states FOB is "out of it."  CSW asked them both to please make sure they get some rest.  MOB agreed.

## 2014-09-01 NOTE — Progress Notes (Signed)
CSW met with parents at baby's bedside to introduce myself and offer support. MOB was able to converse, but FOB appears to be experiencing shock to the point of inability to talk. He sat behind MOB and frequently put his head down in his hands. MOB had a friend coming in to visit and CSW asked if CSW could come back and sit with MOB again later. MOB smiled and agreed. CSW received a medical update from D. Tabb/NNP and appreciates the opportunity to be involved in supporting the parents through the process being given news about their infant and making decisions about his care/possible end of life discussion.

## 2014-09-01 NOTE — Lactation Note (Signed)
This note was copied from the chart of Duke EnergyBoyA [name]. Lactation Consultation Note  Patient Name: Krystal Nguyen NWGNF'AToday's Date: 09/01/2014 Reason for consult: Initial assessment;Late preterm infant;Infant < 6lbs;Multiple gestation;Other (Comment) (twin "B" died in NICU at <24 hours w/multiple anomalies) This is mom's first baby.  His twin, Krystal "B" died in NICU with multiple anomalies today.  Mom is very sleepy and baby has been fed bottles of alimentum while parents grieving with other twin.  Baby received 10 ml's of formula at 1800 and FOB is holding baby STS when LC arrived.  Mom is willing to try latching baby "A" to (R) breast.  She has very large, pendulous breasts and flat nipples, especially (R).  RN to provide shells for mom to wear starting in am.  DEBP set-up and demonstrated to parents.  LC reviewed importance of pumping both to stimulate milk production and to stretch out mom's nipples.  Larger flanges (#27) used due to wide-based nipples and #20 and #24 NS also provided, with #20 fitting comfortably according to mom.  LC assisted and enticed baby with some alimentum inside NS, but he would not sustain latch or begin any rhythmical sucking.  As this feeding is already 20 minutes past due, LC stressed need to feed by bottle and mom to pump.  Mom able to hold pump parts for double pumping and nipples are being tugged into flange very slightly.  RN informed that mom will need help with subsequent pumping tonight and mom encouraged to pump 15 minutes every 3 hours and to use hand expression and breast massage for additional stimulation.  LC reviewed milk storage guidelines, encouraged STS and also provided and reviewed LPI handout for parents.  Mom encouraged to feed baby 8-12 times/24 hours and with feeding cues. LC encouraged review of Baby and Me pp 9, 14 and 20-25 for STS and BF information. LC provided [employer] Resource brochure and reviewed [facility] and list of community and web site  resources.   Maternal Data Formula Feeding for Exclusion: No Has patient been taught Hand Expression?: Yes (LC demonstrated but mom states her breasts are tender and sensitive and no drops seen) Does the patient have breastfeeding experience prior to this delivery?: No  Feeding Feeding Type: Breast Fed  LATCH Score/Interventions Latch: Too sleepy or reluctant, no latch achieved, no sucking elicited. Intervention(s): Skin to skin;Teach feeding cues;Waking techniques Intervention(s): Adjust position;Assist with latch;Breast compression  Audible Swallowing: None Intervention(s): Skin to skin;Hand expression Intervention(s): Skin to skin;Hand expression  Type of Nipple: Flat Intervention(s): Shells;Double electric pump (RN to provide shells to mom later tonight)  Comfort (Breast/Nipple): Soft / non-tender     Hold (Positioning): Full assist, staff holds infant at breast Intervention(s): Breastfeeding basics reviewed;Support Pillows;Position options;Skin to skin (football hold recommended due to mom's large breasts and flat nipples)  LATCH Score: 3 (LC assisted but no latch achieved)  Lactation Tools Discussed/Used WIC Program: No (may apply now) Pump Review: Setup, frequency, and cleaning;Milk Storage Initiated by:: LC Date initiated:: 09/01/14 #20 and #24 NS with handout LPI handout for parents STS, minimum of q3h feedings and supplement based on day of life parameters for LPI Hand expression and breast massage for additional stimulation  Consult Status Consult Status: Follow-up Date: 09/02/14 Follow-up type: In-patient    [facility] Nguyen [facility] 09/01/2014, 9:48 PM

## 2014-09-01 NOTE — Op Note (Signed)
Patient Information    Patient Name Sex DOB SSN   Krystal Nguyen, Krystal Nguyen Female 1990/08/26 [id number]    Op Note by Lazaro ArmsLuther H Jaymen Fetch, MD at 09/01/2014 12:50 AM    Author: Lazaro ArmsLuther H Xiadani Damman, MD Service: Obstetrics/Gynecology Author Type: Physician   Filed: 09/01/2014 12:56 AM Note Time: 09/01/2014 12:50 AM Status: Signed   Editor: Lazaro ArmsLuther H Aranda Bihm, MD (Physician)     Expand All Collapse All   Preoperative diagnosis: 1. Intrauterine pregnancy at 7724w1d weeks gestation  2. Twins  3. Twin A breech  4. PROM, labor  5. Twin B with multiple anomalies, GI, GU, heart abnormalities   Postoperative diagnosis: Same as above   Procedure: Primary cesarean section  Surgeon: Lazaro ArmsLuther H Johniece Hornbaker MD  Assistant:   Anesthesia: Spinal  Findings: .   Over a low transverse incision was delivered Twin A, a viable female in the frank breech presentation at 0015 with Apgars of pending and pending weighing pending lbs. oz.  Twin B was vertex and delivered at 0017 with Apgars and weight pending Uterus, tubes and ovaries were all normal. There were no other significant findings  Description of operation: Patient was taken to the operating room and placed in the sitting position where she underwent a spinal anesthetic. She was then placed in the supine position with tilt to the left side. When adequate anesthetic level was obtained she was prepped and draped in usual sterile fashion and a Foley catheter was placed. A Pfannenstiel skin incision was made and carried down sharply to the rectus fascia which was scored in the midline extended laterally. The fascia was taken off the muscles both superiorly and without difficulty. The muscles were divided. The peritoneal cavity was entered. Bladder blade was placed, no bladder flap was created. A low transverse  hysterotomy incision was made and delivered a viable female infant at 640015 with Apgars of and weighing lbs oz. Cord pH was obtained and was pending. Twin B was vertex at 0017 with weight and Apgars pending. The uterus was exteriorized. It was closed in 2 layers, the first being a running interlocking layer and the second being an imbricating layer using 0 monocryl on a CTX needle. There was good resulting hemostasis. The uterus tubes and ovaries were all normal. Peritoneal cavity was irrigated vigorously. The muscles and peritoneum were reapproximated loosely. The fascia was closed using 0 Vicryl in running fashion. Subcutaneous tissue was made hemostatic and irrigated. The skin was closed using 4-0 Vicryl on a Keith needle in a subcuticular fashion. Ex[parel was injected into the SQ. Dermabond was placed for additional wound integrity and to serve as a barrier. Blood loss for the procedure was 700 cc. The patient received 2 gram of Ancef prophylactically. The patient was taken to the recovery room in good stable condition with all counts being correct x3.  EBL 700 cc  [name] H 09/01/2014 12:50 AM

## 2014-09-01 NOTE — Anesthesia Postprocedure Evaluation (Signed)
  Anesthesia Post-op Note  Patient: Krystal MoanShardne Breden  Procedure(s) Performed: Procedure(s) (LRB): CESAREAN SECTION (N/A)  Patient Location: PACU  Anesthesia Type: Spinal  Level of Consciousness: awake and alert   Airway and Oxygen Therapy: Patient Spontanous Breathing  Post-op Pain: mild  Post-op Assessment: Post-op Vital signs reviewed, Patient's Cardiovascular Status Stable, Respiratory Function Stable, Patent Airway and No signs of Nausea or vomiting  Last Vitals:  Filed Vitals:   09/01/14 0145  BP:   Pulse: 92  Temp:   Resp: 26    Post-op Vital Signs: stable   Complications: No apparent anesthesia complications

## 2014-09-01 NOTE — Progress Notes (Signed)
CSW checked in with MOB in her first floor room to offer to hold baby B before going to the morgue.  MOB stated that she would like to.  She was alone at this time.  CSW informed NICU RN of MOB's wishes, who brought baby B to her.  CSW sat with MOB while she held her son.  She talked about how beautiful he is and how she knows he is going to a better place.  She is able to articulate well and seems to benefit from and be appreciative of talking about her feelings.  CSW prepared her for the difficulty in telling the nurses that she is ready to let baby go and suggests that she may ask her nurse to tell her it's time to take baby out of the room.  CSW encouraged her to take note of each of baby's features and the weight of him on her chest.  CSW ensured that she felt okay being alone in the room with baby and asked if she needed anything before CSW left her alone.  MOB smiled and said she was okay.  CSW will follow up tomorrow to continue to offer support and assistance.

## 2014-09-01 NOTE — Progress Notes (Signed)
   09/01/14 1300  Clinical Encounter Type  Visited With Patient;Family;Patient and family together;Health care provider  Visit Type Spiritual support;Social support  Referral From Nurse  Spiritual Encounters  Spiritual Needs Emotional;Grief support  Stress Factors  Patient Stress Factors Loss of control;Loss  Family Stress Factors Loss of control;Loss   Have visited with London SheerShardne, Tyron, their mothers, and friends throughout the day, both on MBU and in NICU, providing emotional and logistical support.  Participated in NICU family conference.  Family is heartbroken and tearful.  Couple is also exhausted from being up so much of the night.  Peyton will continue to follow closely for support, including providing Comfort materials when appropriate.  Please also page 24/7 as needs arise.  Thank you.  [address]Chaplain Curtez Brallier [place] Heights[place]unde[place], [place] [phone]

## 2014-09-01 NOTE — Progress Notes (Signed)
Dr. Joana Reameravanzo, neonatologist, in to speak with patient regarding Baby B. Condition of infant is deteriorating. Chaplain paged and support offered to patient and support people

## 2014-09-01 NOTE — Anesthesia Procedure Notes (Signed)
Spinal Patient location during procedure: OR Start time: 09/01/2014 12:01 AM Staffing Anesthesiologist: Karie SchwalbeJUDD, Katharine Rochefort Performed by: anesthesiologist  Preanesthetic Checklist Completed: patient identified, site marked, surgical consent, pre-op evaluation, timeout performed, IV checked, risks and benefits discussed and monitors and equipment checked Spinal Block Patient position: sitting Prep: ChloraPrep Patient monitoring: continuous pulse ox, blood pressure and heart rate Approach: midline Location: L3-4 Injection technique: single-shot Needle Needle type: Sprotte  Needle gauge: 24 G Needle length: 9 cm Assessment Sensory level: T4 Additional Notes Functioning IV was confirmed and monitors were applied. Sterile prep and drape, including hand hygiene, mask and sterile gloves were used. The patient was positioned and the spine was prepped. The skin was anesthetized with lidocaine.  Free flow of clear CSF was obtained prior to injecting local anesthetic into the CSF.  The spinal needle aspirated freely following injection.  The needle was carefully withdrawn.  The patient tolerated the procedure well. Consent was obtained prior to procedure with all questions answered and concerns addressed. Risks including but not limited to bleeding, infection, nerve damage, paralysis, failed block, inadequate analgesia, allergic reaction, high spinal, itching and headache were discussed and the patient wished to proceed.   Karie SchwalbeMary Devery Murgia, MD

## 2014-09-01 NOTE — Progress Notes (Signed)
CSW say with MOB alone at baby's bedside while MOB held baby's hand.  MOB was very receptive to CSW's presence and intervention.  She was very open to talking about her feelings.  She reported to CSW that she had a miscarriage on August 30, 2013 and that "March is not my month."  CSW gave condolences, and encouraged her to allow herself to celebrate the birth of her twins, even though Amonte is very ill.  MOB states she thinks she will become depressed after going through this experience.  She also made the comment that she thinks she will be scared to be alone with her other twin because she will be afraid that something will happen to him.  CSW validated her feelings and explained that it is extremely normal to become depressed when dealing with such a highly emotional situation involving her children.  CSW told MOB that we will develop a plan to care for her mental health as she copes with her emotions within the coming months/years.  CSW discussed CSW's ongoing role throughout her and baby's hospitalization and the importance of continuing to talk about and process her feelings.  CSW provided contact information.  MOB seemed greatly appreciative.     

## 2014-09-01 NOTE — Op Note (Signed)
Preoperative diagnosis:  1.  Intrauterine pregnancy at 3334w1d  weeks gestation                                         2.  Twins                                         3.  Twin A breech                                         4.  PROM, labor                                         5.  Twin B with multiple anomalies, GI, GU, heart abnormalities   Postoperative diagnosis:  Same as above   Procedure:  Primary cesarean section  Surgeon:  Lazaro ArmsLuther H Quintara Bost MD  Assistant:    Anesthesia: Spinal  Findings:  .    Over a low transverse incision was delivered Twin A, a viable female in the frank breech presentation at 0015 with Apgars of pending and pending weighing pending lbs.  oz.  Twin B was vertex and delivered at 0017 with Apgars and weight pending Uterus, tubes and ovaries were all normal.  There were no other significant findings  Description of operation:  Patient was taken to the operating room and placed in the sitting position where she underwent a spinal anesthetic. She was then placed in the supine position with tilt to the left side. When adequate anesthetic level was obtained she was prepped and draped in usual sterile fashion and a Foley catheter was placed. A Pfannenstiel skin incision was made and carried down sharply to the rectus fascia which was scored in the midline extended laterally. The fascia was taken off the muscles both superiorly and without difficulty. The muscles were divided.  The peritoneal cavity was entered.  Bladder blade was placed, no bladder flap was created.  A low transverse hysterotomy incision was made and delivered a viable female  infant at 580015 with Apgars of  and  weighing lbs  oz.  Cord pH was obtained and was pending.  Twin B was vertex at 0017 with weight and Apgars pending. The uterus was exteriorized. It was closed in 2 layers, the first being a running interlocking layer and the second being an imbricating layer using 0 monocryl on a CTX needle. There was  good resulting hemostasis. The uterus tubes and ovaries were all normal. Peritoneal cavity was irrigated vigorously. The muscles and peritoneum were reapproximated loosely. The fascia was closed using 0 Vicryl in running fashion. Subcutaneous tissue was made hemostatic and irrigated. The skin was closed using 4-0 Vicryl on a Keith needle in a subcuticular fashion.  Ex[parel was injected into the SQ.  Dermabond was placed for additional wound integrity and to serve as a barrier. Blood loss for the procedure was 700 cc. The patient received 2 gram of Ancef prophylactically. The patient was taken to the recovery room in good stable condition with all counts being correct x3.  EBL 700 cc  Guillermo Nehring H  09/01/2014 12:50 AM

## 2014-09-01 NOTE — Transfer of Care (Signed)
Immediate Anesthesia Transfer of Care Note  Patient: Krystal Nguyen  Procedure(s) Performed: Procedure(s): CESAREAN SECTION (N/A)  Patient Location: PACU  Anesthesia Type:Spinal  Level of Consciousness: awake  Airway & Oxygen Therapy: Patient Spontanous Breathing  Post-op Assessment: Report given to RN and Post -op Vital signs reviewed and stable  Post vital signs: stable  Last Vitals:  Filed Vitals:   08/31/14 2321  BP: 133/91  Pulse: 99  Temp: 37.2 C  Resp: 16    Complications: No apparent anesthesia complications

## 2014-09-01 NOTE — Progress Notes (Addendum)
Chaplain paged at 0428, arriving in the NICU at [id number]. Chaplain provided spiritual comfort and care to Ms Krystal Nguyen, and her Significant Other. Prayers for her twin boy B, named Amonte, were prayed. Ms Krystal Nguyen was advised that chaplains are available at any time to assist her.  Recommend that chaplains be immediately paged should child or parents require spiritual care or comfort.  Benjie Karvonenharles D. Davion Flannery, DMin, MDiv Chaplain

## 2014-09-02 LAB — URINE MICROSCOPIC-ADD ON

## 2014-09-02 LAB — URINALYSIS, ROUTINE W REFLEX MICROSCOPIC
Bilirubin Urine: NEGATIVE
GLUCOSE, UA: NEGATIVE mg/dL
KETONES UR: NEGATIVE mg/dL
LEUKOCYTES UA: NEGATIVE
Nitrite: NEGATIVE
PH: 6 (ref 5.0–8.0)
PROTEIN: NEGATIVE mg/dL
SPECIFIC GRAVITY, URINE: 1.02 (ref 1.005–1.030)
UROBILINOGEN UA: 0.2 mg/dL (ref 0.0–1.0)

## 2014-09-02 NOTE — Progress Notes (Signed)
Clinical Social Work Department PSYCHOSOCIAL ASSESSMENT - MATERNAL/CHILD 09/02/2014  Patient:  Krystal Nguyen,Krystal Nguyen  Account Number:  [id number]  Admit Date:  08/31/2014  Childs Name:   Baby A: Krystal Nguyen Baby B: Krystal Nguyen    Clinical Social Worker:  Krystal Tejera, LCSW   Date/Time:  09/02/2014 02:00 PM  Date Referred:        Other referral source:   No referral-loss of twin B    I:  FAMILY / HOME ENVIRONMENT Child's legal guardian:  PARENT  Guardian - Name Guardian - Age Guardian - Address  Krystal Nguyen 24 [address] [place], [place] [address]  Krystal Nguyen  same   Other household support members/support persons Other support:   Parents have a good support system of family and friends.    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  FOB drives a truck.   Financial resources:  Private Insurance If Medicaid - County:    School / Grade:   Maternity Care Coordinator / Child Services Coordination / Early Interventions:  Cultural issues impacting care:   None stated    III  STRENGTHS Strengths  Adequate Resources  Compliance with medical plan  Home prepared for Child (including basic supplies)  Supportive family/friends  Understanding of illness   Strength comment:  Family was aware and prepared by medical staff regarding Baby B's anomalies.   IV  RISK FACTORS AND CURRENT PROBLEMS Current Problem:  YES   Risk Factor & Current Problem Patient Issue Family Issue Risk Factor / Current Problem Comment  Other - See comment Y Y Grief-At risk for PPD   N N     V  SOCIAL WORK ASSESSMENT  CSW met with MOB and MGM in her first floor room to follow up on much time spent with the family yesterday during and after the passing of twin B.  MOB states she has renamed Baby  A and given him Baby B's name, Krystal in his brother's honor.  She reports doing well today, and "staying strong."  She reports her faith in God helps her to stay strong.  CSW spent over an hour with MOB making arrangements and discussing a plan for her mental health.  Parents have chosen [employer] services to cremate Baby B and one of the directors will come to the hospital tomorrow at 10am to sign papers with parents.  They do not wish to have a services.  CSW provided grief and depression/PPD education to MOB and MGM.  FOB had left at this point, but MOB states she will talk to him and that she knows how best to communicate with him anyway.  CSW recommends counseling and MOB is very receptive.  She states she had a counselor after her miscarriage whom she really liked.  She plans to call her (she cannot recall her name at this time) and make an appointment.  MOB is not interested in taking an antidepressant, but understands the benefits and time is takes to take effect and will consider starting medication if she thinks it would be helpful at any point.  MOB has already informed CSW that she is concerned that she will be afraid to be alone at home with baby for fear that something will happen to him.  CSW normalized this feeling   and helped MOB come up with a list of local support people who she can call if she needs to not be alone.  CSW suggests she talk with these people now and ask for permission to call if she needs so that when the time comes, she will not hesitate.  She agrees.  CSW talked about safe sleep, as putting your baby to bed can be an anxiety provoking time.  CSW encouraged her to know that she has put her baby to bed safely and that the rest is out of her control.  MOB was extremely engaged and attentive and contributed to the conversation.  MGM was very quiet and held the baby while CSW spoke with MOB.  MOB was very appreciative of the conversation with CSW and agrees to call CSW  in the future if needed.  She thanked CSW for the support given and reached out and hugged CSW.  CSW thanked MOB for allowing CSW to be a part of her sons' lives and her journey over the last two days.     VI SOCIAL WORK PLAN Social Work Plan  Information/Referral to Community Resources  Patient/Family Education   Type of pt/family education:   PPD signs and symptoms  Discussion about grief process  Importance of mental health care   If child protective services report - county:   If child protective services report - date:   Information/referral to community resources comment:   Funeral Home services-cremation vs burial  [employer]  CC[facility]   Other social work plan:   

## 2014-09-02 NOTE — Anesthesia Postprocedure Evaluation (Signed)
  Anesthesia Post-op Note  Patient: Krystal Nguyen  Procedure(s) Performed: Procedure(s): CESAREAN SECTION MULTI-GESTATIONAL (N/A)  Patient Location: Mother/Baby  Anesthesia Type:Spinal  Level of Consciousness: awake, alert , oriented and patient cooperative  Airway and Oxygen Therapy: Patient Spontanous Breathing  Post-op Pain: mild  Post-op Assessment: Patient's Cardiovascular Status Stable, Respiratory Function Stable, No headache, No backache, No residual numbness and No residual motor weakness  Post-op Vital Signs: stable  Last Vitals:  Filed Vitals:   09/02/14 0600  BP: 107/64  Pulse: 70  Temp: 36.7 C  Resp: 18    Complications: No apparent anesthesia complications

## 2014-09-02 NOTE — Progress Notes (Signed)
Post Partum Day #1 Subjective:  Krystal MoanShardne Nguyen is a 24 y.o. [id number] 9937w1d s/p c-section. Baby B passed away yesterday at noon, mother coping well and spiritual services following.  No acute events overnight.  Pt denies problems with ambulating, voiding or po intake.  She denies nausea or vomiting.  Pain is well controlled.  She has had flatus. She has not had bowel movement.  Lochia Minimal.  Plan for birth control is oral progesterone-only contraceptive.  Method of Feeding: Breast/Bottle  Objective: Blood pressure 107/64, pulse 70, temperature 98 F (36.7 C), temperature source Oral, resp. rate 18, height 5\' 5"  (1.651 m), weight 120.657 kg (266 lb), last menstrual period 12/22/2013, SpO2 100 %, unknown if currently breastfeeding.  Physical Exam:  General: alert, cooperative and no distress Lochia:normal flow Chest: CTAB, no increased work of breathing Heart: RRR no m/r/g Abdomen: +BS, soft, nontender,  Uterine Fundus: firm DVT Evaluation: No evidence of DVT seen on physical exam. Extremities: No edema   Recent Labs  08/31/14 2240 09/01/14 0330  HGB 10.7* 9.1*  HCT 31.0* 26.6*    Assessment/Plan:  ASSESSMENT: Krystal Nguyen is a 24 y.o. [id number] 4537w1d s/p c-section.  Plan for discharge tomorrow, Breastfeeding and Lactation consult   LOS: 2 days   Araceli BoucheRumley, [place] N 09/02/2014, 7:46 AM    I have seen and examined this patient and agree the above assessment. CRESENZO-DISHMAN,Fidel Caggiano 09/02/2014 6:47 PM

## 2014-09-02 NOTE — Progress Notes (Signed)
Pt was awake and sitting up in chair when I arrived. Several guests were bedside, including baby's dad and grandmother. Pt had several questions re pediatrician as her pref is no longer accepting new pts. [facility] and I learned from staff they see pregnant moms. Left mssg for nurse to see Yeng when she is available in order to discuss additional options. Also, I presented pt w/pray shawl and bereavement booklet. She was very thankful for information and visit. Encouraged pt to have Chaplain paged as she needs. Marjory Liesamela Carrington Holder Chaplain   09/02/14 1100  Clinical Encounter Type  Visited With Patient and family together

## 2014-09-03 ENCOUNTER — Encounter (HOSPITAL_COMMUNITY): Payer: Self-pay | Admitting: Obstetrics & Gynecology

## 2014-09-03 NOTE — Progress Notes (Signed)
Post Partum Day #2 Subjective:  Malachy MoanShardne Rennert is a 24 y.o. [id number] 9361w1d s/p c-section.  No acute events overnight.  Pt denies problems with ambulating and voiding. Reports poor po intake, with some dizziness.  She denies nausea or vomiting.  Pain is well controlled.  She has had flatus. She has not had bowel movement.  Lochia Minimal.  Plan for birth control is oral progesterone-only contraceptive.  Method of Feeding: Breast/Bottle  Objective: Blood pressure 130/83, pulse 82, temperature 98.6 F (37 C), temperature source Oral, resp. rate 20, height 5\' 5"  (1.651 m), weight 120.657 kg (266 lb), last menstrual period 12/22/2013, SpO2 100 %, unknown if currently breastfeeding.  Physical Exam:  General: alert, cooperative and no distress Lochia:normal flow Chest: CTAB Heart: RRR no m/r/g Abdomen: soft, nontender,  Uterine Fundus: firm DVT Evaluation: No evidence of DVT seen on physical exam. Extremities: No edema   Recent Labs  08/31/14 2240 09/01/14 0330  HGB 10.7* 9.1*  HCT 31.0* 26.6*    Assessment/Plan:  ASSESSMENT: Malachy MoanShardne Lamphier is a 24 y.o. J4N8295G2P0112 5861w1d s/p c-section  Plan for discharge tomorrow, Breastfeeding, Lactation consult and Social Work consult. Discussed working on diet today; suspect dizziness will resolve with improved PO intake.   LOS: 3 days   Araceli BoucheRumley, [place] N 09/03/2014, 8:58 AM

## 2014-09-03 NOTE — Lactation Note (Signed)
This note was copied from the chart of BoyA Thaily Rowlette. Lactation Consultation Note Mom has had an emotional day, d/t the funeral home came to get the twin that past away and she had to sign papers for cremation and fill out a lot of papers. Mom had a lot of anxiety. She hasn't slept much, eatten, and even requested the other twin to go to nursery so she could try to rest.  Mom has DEBP in room at bedside, hasn't pumped any. Hasn't felt like it. Has just been formula feeding the baby. Hasn't put the baby to the breast. Stated when she did yesterday he only sucked a few times then slept.  Explained newborn behavior of LPI.  MD wanted to give mom time to cope and not push the BF at this time.  Dicussed w/mom her plan, states she will not be returning back to work and would like to BF the baby, but right now she hasn't been able to. Explained I understood and knew she was taking things hour to hour. Mom thanked me for understanding and patients.  Asked mom to call for nursing or LC when she is ready to latch baby or/and pump.  D/t only being formula fed, MD wanted to change formula and calories. Will change to Similac 22 cal. Patient Name: BoyA Dayja Chiaramonte Today's Date: 09/03/2014 Reason for consult: Follow-up assessment   Maternal Data    Feeding Feeding Type: Bottle Fed - Formula Nipple Type: Slow - flow  LATCH Score/Interventions                      Lactation Tools Discussed/Used     Consult Status Consult Status: Follow-up Date: 09/03/14 Follow-up type: In-patient    Cali Hope G 09/03/2014, 2:11 PM    

## 2014-09-04 MED ORDER — IBUPROFEN 600 MG PO TABS: 600.0000 mg | ORAL_TABLET | Freq: Four times a day (QID) | ORAL | Status: DC

## 2014-09-04 MED ORDER — OXYCODONE-ACETAMINOPHEN 5-325 MG PO TABS: 1.0000 | ORAL_TABLET | ORAL | Status: DC | PRN

## 2014-09-04 NOTE — Discharge Summary (Signed)
Obstetric Discharge Summary Reason for Admission: cesarean section and twin A breech, fetal anomalies in twin B Prenatal Procedures: ultrasound Intrapartum Procedures: cesarean: low cervical, transverse Postpartum Procedures: none Complications-Operative and Postpartum: none   Krystal Nguyen is a 24 y.o. G2P0010, admitted in labor, twin pregnancy at 5854w0d admitted for a primary Caesarean section due to Twin A breech and ROM.  Admitted with contractions q3 minutes cervix 2/60/-2 breech  Procedure: Primary cesarean section Surgeon: Lazaro ArmsLuther H Eure MD Over a low transverse incision was delivered Twin A, a viable female in the frank breech presentation at 0015 with Apgars of pending and pending weighing pending lbs. oz.  Twin B was vertex and delivered at 0017 with Apgars and weight pending Uterus, tubes and ovaries were all normal. There were no other significant findings  HEMOGLOBIN  Date Value Ref Range Status  09/01/2014 9.1* 12.0 - 15.0 g/dL Final   HCT  Date Value Ref Range Status  09/01/2014 26.6* 36.0 - 46.0 % Final    Physical Exam:  General: alert, cooperative, fatigued and no distress  Lochia: appropriate Uterine Fundus: firm Incision: no significant drainage, no significant erythema DVT Evaluation: No evidence of DVT seen on physical exam.  Discharge Diagnoses: Preterm delivery via C-section  Discharge Information: Date: 09/04/2014 Activity: pelvic rest Diet: routine Medications: Ibuprofen and Percocet Condition: stable Instructions: refer to practice specific booklet Discharge to: home Follow-up Information    [facility]   Contact information:   [address] [place] [id number] [phone]      Newborn Data:   Jocelyn LamerWatford, BoyA Sarabella [[id number]][[id number]]  Live born female  Birth Weight: 5 lb 5.5 oz (2424 g) APGAR: 9, 10   Santa GeneraWatford, BoyB Kennede [[id number]][[id number]]  Live born female  Birth Weight: 5 lb 6.1 oz  (2440 g) APGAR: 4, 6 Deceased after 24 hours of life  Baby A home with mother.  Henson,Amber 09/04/2014, 8:39 AM   I spoke with and examined patient and agree with resident/PA/SNM's note and plan of care.  Eating ok- not much of an appetite, drinking, voiding, ambulating well.  +flatus.  Lochia and pain wnl.  Denies dizziness, lightheadedness, or sob. No complaints.  Discussed eating small frequent snacks w/ protein to help w/ nutrition while her appetite is decreased from grieving.  Baby B neonatal death after 24hrs of life d/t known multiple anomalies. SW & chaplain have both been involved in her care. She is grieving appropriately. Plans to breastfeed baby A- pumping yesterday and baby latched on a few times. Plans POPs for contraception and outpatient circumcision.  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 Avamae Dehaan, CNM, [facility]NP[facility] 09/04/2014 8:46 AM

## 2014-09-04 NOTE — Lactation Note (Signed)
This note was copied from the chart of [name]. Lactation Consultation Note  Follow up visit with mom prior to discharge.  She has been bottle feeding formula and not pumping consistently.  I asked mom if she wanted to give baby breast milk and she verbalizes she does.  Reviewed supply and demand.  Mom states right breast is feeling fuller.  She last pumped yesterday evening and obtained a small amount of colostrum.  Discussed pump rental with mom.  She states she has been unable to get in touch with [employer].  Mom states her friend has a new DEBP she can use.  Manual pump given with instructions if needed prn.  Reviewed milk storage.  Encouraged to call for concerns/assist prn.  Patient Name: Krystal Nguyen [name] MVHQI'OToday's Date: 09/04/2014     Maternal Data    Feeding Feeding Type: Formula Nipple Type: Slow - flow  LATCH Score/Interventions                      Lactation Tools Discussed/Used     Consult Status      Huston FoleyMOULDEN, Mohanad Carsten S 09/04/2014, 10:37 AM

## 2014-09-05 ENCOUNTER — Other Ambulatory Visit: Payer: BC Managed Care – PPO

## 2014-09-06 ENCOUNTER — Encounter: Payer: Self-pay | Admitting: Obstetrics & Gynecology

## 2014-09-08 ENCOUNTER — Ambulatory Visit (HOSPITAL_COMMUNITY): Payer: BC Managed Care – PPO

## 2014-09-12 NOTE — Patient Instructions (Signed)
   Your procedure is scheduled on:  Wednesday, March 23  [facility] at: 7:30 AM Pick up the phone at the desk and dial [phone] and inform us of your arrival.  Please call this number if you have any problems the morning of surgery: [phone]  Remember: Do not eat or drink after midnight: Tuesday Take these medicines the morning of surgery with a SIP OF WATER:  Do not wear jewelry, make-up, or FINGER nail polish No metal in your hair or on your body. Do not wear lotions, powders, perfumes.  You may wear deodorant.  Do not bring valuables to the hospital. Contacts, dentures or bridgework may not be worn into surgery.  Leave suitcase in the car. After Surgery it may be brought to your room. For patients being admitted to the hospital, checkout time is 11:00am the day of discharge.

## 2014-09-13 ENCOUNTER — Inpatient Hospital Stay (HOSPITAL_COMMUNITY)
Admission: RE | Admit: 2014-09-13 | Discharge: 2014-09-13 | Disposition: A | Discharge status: Home / Self Care | Payer: BC Managed Care – PPO | Source: Ambulatory Visit

## 2014-09-14 ENCOUNTER — Encounter (HOSPITAL_COMMUNITY): Admission: RE | Payer: Self-pay | Source: Ambulatory Visit

## 2014-09-14 ENCOUNTER — Inpatient Hospital Stay (HOSPITAL_COMMUNITY)
Admission: RE | Admit: 2014-09-14 | Payer: BC Managed Care – PPO | Source: Ambulatory Visit | Admitting: Obstetrics & Gynecology

## 2014-09-14 ENCOUNTER — Encounter (HOSPITAL_COMMUNITY): Payer: Self-pay | Admitting: *Deleted

## 2014-09-14 SURGERY — Surgical Case
Anesthesia: Choice | Site: Abdomen

## 2014-10-19 ENCOUNTER — Ambulatory Visit (INDEPENDENT_AMBULATORY_CARE_PROVIDER_SITE_OTHER): Payer: BC Managed Care – PPO | Admitting: Obstetrics & Gynecology

## 2014-10-19 ENCOUNTER — Encounter: Payer: Self-pay | Admitting: Obstetrics & Gynecology

## 2014-10-19 MED ORDER — NORGESTREL-ETHINYL ESTRADIOL 0.3-30 MG-MCG PO TABS: 1.0000 | ORAL_TABLET | Freq: Every day | ORAL | Status: DC

## 2014-10-19 NOTE — Progress Notes (Signed)
  Subjective:     Krystal Nguyen is a 24 y.o. S AA female who presents for a postpartum visit. Please note that Twin A is alive and well, but that Twin B died right after birth from multiple fetal anomalies. She is 6 weeks postpartum following a low cervical transverse Cesarean section. I have fully reviewed the prenatal and intrapartum course. The delivery was at 36  gestational weeks. Outcome: primary cesarean section, low transverse incision. Anesthesia: spinal. Postpartum course has been normal. Baby's course has been  Normal. Baby is feeding by both breast and bottle - Carnation Good Start. She is only breast feeding once per day and plans to stop soon. Bleeding no bleeding. Bowel function is some constipation. Bladder function is normal. Patient is not sexually active. Contraception method is OCP (estrogen/progesterone). Postpartum depression screening: negative. She is dealing with feeling anxious about the health of her baby and plans to return to see her counselor.  The following portions of the patient's history were reviewed and updated as appropriate: allergies, current medications, past family history, past medical history, past social history, past surgical history and problem list.  Review of Systems A comprehensive review of systems was negative.   Objective:    BP 120/64 mmHg  Pulse 64  Temp(Src) 98.5 F (36.9 C) (Oral)  Resp 20  Ht 5\' 6"  (1.676 m)  Wt 244 lb 11.2 oz (110.995 kg)  BMI 39.51 kg/m2  Breastfeeding? Yes  General:  alert   Breasts:  inspection negative, no nipple discharge or bleeding, no masses or nodularity palpable  Lungs: clear to auscultation bilaterally  Heart:  regular rate and rhythm, S1, S2 normal, no murmur, click, rub or gallop  Abdomen: soft, non-tender; bowel sounds normal; no masses,  no organomegaly, rectus diastesis, well-healed incision   Vulva:  normal  Vagina: not evaluated  Cervix:  not evaluated  Corpus: not examined  Adnexa:  not  evaluated  Rectal Exam: Not performed.        Assessment:     Normal postpartum exam. Pap smear not done at today's visit.   Plan:    1. Contraception: OCP (estrogen/progesterone) 2. H/O GDM with pregnancy- plan for 2 hour GTT

## 2014-11-16 ENCOUNTER — Other Ambulatory Visit: Payer: BC Managed Care – PPO

## 2014-11-17 ENCOUNTER — Other Ambulatory Visit: Payer: BC Managed Care – PPO

## 2014-11-17 VITALS — BP 116/77 | HR 90

## 2014-11-17 DIAGNOSIS — O24439 Gestational diabetes mellitus in the puerperium, unspecified control: Secondary | ICD-10-CM

## 2014-11-18 LAB — GLUCOSE TOLERANCE, 2 HOURS
GLUCOSE, 2 HOUR: 68 mg/dL — AB (ref 70–139)
Glucose, Fasting: 99 mg/dL (ref 70–99)

## 2014-12-09 ENCOUNTER — Encounter (HOSPITAL_COMMUNITY): Payer: Self-pay | Admitting: Emergency Medicine

## 2014-12-09 ENCOUNTER — Emergency Department (HOSPITAL_COMMUNITY)
Admission: EM | Admit: 2014-12-09 | Discharge: 2014-12-09 | Disposition: A | Discharge status: Home / Self Care | Payer: BC Managed Care – PPO | Attending: Emergency Medicine | Admitting: Emergency Medicine

## 2014-12-09 DIAGNOSIS — R110 Nausea: Secondary | ICD-10-CM | POA: Diagnosis not present

## 2014-12-09 DIAGNOSIS — F53 Postpartum depression: Secondary | ICD-10-CM

## 2014-12-09 DIAGNOSIS — J45909 Unspecified asthma, uncomplicated: Secondary | ICD-10-CM | POA: Diagnosis not present

## 2014-12-09 DIAGNOSIS — R61 Generalized hyperhidrosis: Secondary | ICD-10-CM | POA: Insufficient documentation

## 2014-12-09 DIAGNOSIS — Z8632 Personal history of gestational diabetes: Secondary | ICD-10-CM | POA: Insufficient documentation

## 2014-12-09 DIAGNOSIS — F419 Anxiety disorder, unspecified: Secondary | ICD-10-CM | POA: Diagnosis present

## 2014-12-09 DIAGNOSIS — O99345 Other mental disorders complicating the puerperium: Secondary | ICD-10-CM

## 2014-12-09 DIAGNOSIS — Z79899 Other long term (current) drug therapy: Secondary | ICD-10-CM | POA: Insufficient documentation

## 2014-12-09 DIAGNOSIS — R0981 Nasal congestion: Secondary | ICD-10-CM | POA: Insufficient documentation

## 2014-12-09 DIAGNOSIS — Z8619 Personal history of other infectious and parasitic diseases: Secondary | ICD-10-CM | POA: Diagnosis not present

## 2014-12-09 DIAGNOSIS — E669 Obesity, unspecified: Secondary | ICD-10-CM | POA: Diagnosis not present

## 2014-12-09 NOTE — ED Notes (Signed)
Pt states she has been feeling "funny", states she was having chest pain, states she was having thoughts that aren't typical for her. States she thinks she had a panic attack. States hx of anxiety, states similar event a month ago. States "I just can't shake it"  No longer breast feeding.

## 2014-12-09 NOTE — Discharge Instructions (Signed)
Depression °Depression refers to feeling sad, low, down in the dumps, blue, gloomy, or empty. In general, there are two kinds of depression: °1. Normal sadness or normal grief. This kind of depression is one that we all feel from time to time after upsetting life experiences, such as the loss of a job or the ending of a relationship. This kind of depression is considered normal, is short lived, and resolves within a few days to 2 weeks. Depression experienced after the loss of a loved one (bereavement) often lasts longer than 2 weeks but normally gets better with time. °2. Clinical depression. This kind of depression lasts longer than normal sadness or normal grief or interferes with your ability to function at home, at work, and in school. It also interferes with your personal relationships. It affects almost every aspect of your life. Clinical depression is an illness. °Symptoms of depression can also be caused by conditions other than those mentioned above, such as: °· Physical illness. Some physical illnesses, including underactive thyroid gland (hypothyroidism), severe anemia, specific types of cancer, diabetes, uncontrolled seizures, heart and lung problems, strokes, and chronic pain are commonly associated with symptoms of depression. °· Side effects of some prescription medicine. In some people, certain types of medicine can cause symptoms of depression. °· Substance abuse. Abuse of alcohol and illicit drugs can cause symptoms of depression. °SYMPTOMS °Symptoms of normal sadness and normal grief include the following: °· Feeling sad or crying for short periods of time. °· Not caring about anything (apathy). °· Difficulty sleeping or sleeping too much. °· No longer able to enjoy the things you used to enjoy. °· Desire to be by oneself all the time (social isolation). °· Lack of energy or motivation. °· Difficulty concentrating or remembering. °· Change in appetite or weight. °· Restlessness or  agitation. °Symptoms of clinical depression include the same symptoms of normal sadness or normal grief and also the following symptoms: °· Feeling sad or crying all the time. °· Feelings of guilt or worthlessness. °· Feelings of hopelessness or helplessness. °· Thoughts of suicide or the desire to harm yourself (suicidal ideation). °· Loss of touch with reality (psychotic symptoms). Seeing or hearing things that are not real (hallucinations) or having false beliefs about your life or the people around you (delusions and paranoia). °DIAGNOSIS  °The diagnosis of clinical depression is usually based on how bad the symptoms are and how long they have lasted. Your health care provider will also ask you questions about your medical history and substance use to find out if physical illness, use of prescription medicine, or substance abuse is causing your depression. Your health care provider may also order blood tests. °TREATMENT  °Often, normal sadness and normal grief do not require treatment. However, sometimes antidepressant medicine is given for bereavement to ease the depressive symptoms until they resolve. °The treatment for clinical depression depends on how bad the symptoms are but often includes antidepressant medicine, counseling with a mental health professional, or both. Your health care provider will help to determine what treatment is best for you. °Depression caused by physical illness usually goes away with appropriate medical treatment of the illness. If prescription medicine is causing depression, talk with your health care provider about stopping the medicine, decreasing the dose, or changing to another medicine. °Depression caused by the abuse of alcohol or illicit drugs goes away when you stop using these substances. Some adults need professional help in order to stop drinking or using drugs. °SEEK IMMEDIATE MEDICAL   CARE IF:  You have thoughts about hurting yourself or others.  You lose touch  with reality (have psychotic symptoms).  You are taking medicine for depression and have a serious side effect. FOR MORE INFORMATION  National Alliance on Mental Illness: www.nami.[employer] of Mental Health: http://www.maynard.net/ Document Released: 06/07/2000 Document Revised: 10/25/2013 Document Reviewed: 09/09/2011 [facility] Patient Information 2015 [place], [place]. This information is not intended to replace advice given to you by your health care provider. Make sure you discuss any questions you have with your health care provider.   Emergency Department Resource Guide     Behavioral Health Resources in the Community: Intensive Outpatient Programs Organization         Address  Phone  Notes  [facility] [address], [place], [place] [phone]   [facility] [address], [place], [place] [phone]   [facility] [address], [place], [place]  [phone]   [facility] [address],  [place], [place] [phone] or [phone]    Psychological Services Organization         Address  Phone  Notes  [facility]  [phone]   [facility]  [phone]   [facility] [phone] [address] [place] [phone] or [phone]    Mobile Crisis Teams Organization         Address  Phone  Notes  [facility], Mobile Crisis Care Unit  [phone]   [facility]  [address] [place], [place] [phone]   Doristine Locks [address] [place] [phone]    Self-Help/Support Groups Organization         Address  Phone             Notes  [facility] - variety of support groups  [phone]- [phone] Call for more information  Narcotics Anonymous (NA), [facility] [address], [employer] [place]  2 meetings at this location

## 2014-12-09 NOTE — ED Notes (Signed)
The patient said she recently had a pregnancy and one of the twins passed away.  The paitent says she is feeling anxiuos and feels like her chest is tight.  She has had anxiety before and feels the same way.  She denies any homicidal or suicidal ideations but feels overwhelmed.  She feels like something is going to happen to her other baby.  She also says she is having a headache and abdominal pain.

## 2014-12-09 NOTE — ED Provider Notes (Signed)
CSN: [id number]     Arrival date & time 12/09/14  0046 History  This chart was scribed for Dione Booze, MD by Annye Asa, ED Scribe. This patient was seen in room B18C/B18C and the patient's care was started at 1:58 AM.    Chief Complaint  Patient presents with  . Anxiety    The patient said she recently had a pregnancy and one of the twins passed away.  The paitent says she is feeling anxiuos and feels like her chest is tight.  She has had anxiety before and feels the same way.   The history is provided by the patient. No language interpreter was used.    HPI Comments: Krystal Nguyen is a 24 y.o. female with past medical history of miscarriage, anxiety who presents to the Emergency Department complaining of anxiety. Patient states she felt chest tightness with lightheadedness and "jitteriness" today; she states she "didn't feel normal, feel like herself." She also notes nausea, diaphoresis, and sleep disturbances. She explains that one of twins died just after birth due to multiple anomalies and this has given her great anxiety; states, "I have been alone at home with my baby for the past month and I think it's really just getting to me, I worry about him a lot because his brother died." She denies visual or auditory hallucinations. She denies SI, HI, unexplainable crying spells. She denies further chest pain, SOB.   She is no longer breast feeding. She denies smoking or EtOH use.   Past Medical History  Diagnosis Date  . Miscarriage   . Obesity   . Asthma     childhood  . Gestational diabetes   . Genital herpes    Past Surgical History  Procedure Laterality Date  . No past surgeries    . Cesarean section N/A 08/31/2014    Procedure: CESAREAN SECTION;  Surgeon: Lazaro Arms, MD;  Location: [facility];  Service: Obstetrics;  Laterality: N/A;   Family History  Problem Relation Age of Onset  . Diabetes Maternal Grandmother   . Hypertension Maternal Grandmother   . Cancer Maternal  Grandfather   . Diabetes Father    History  Substance Use Topics  . Smoking status: Never Smoker   . Smokeless tobacco: Never Used  . Alcohol Use: No     Comment: social   OB History    Gravida Para Term Preterm AB TAB SAB Ectopic Multiple Living   Review of Systems  Constitutional: Positive for diaphoresis.  Respiratory: Positive for chest tightness. Negative for shortness of breath.   Gastrointestinal: Positive for nausea.  Neurological: Positive for light-headedness.  Psychiatric/Behavioral: Positive for sleep disturbance. Negative for suicidal ideas. The patient is nervous/anxious.   All other systems reviewed and are negative.   Allergies  Bactrim  Home Medications   Prior to Admission medications   Medication Sig Start Date End Date Taking? Authorizing Provider  Ferrous Sulfate (SLOW FE PO) Take 1 tablet by mouth daily.   Yes Historical Provider, MD  ibuprofen (ADVIL,MOTRIN) 600 MG tablet Take 1 tablet (600 mg total) by mouth every 6 (six) hours. Patient not taking: Reported on 10/19/2014 09/04/14   Bluford Main, MD  norgestrel-ethinyl estradiol (LO/OVRAL,CRYSELLE) 0.3-30 MG-MCG tablet Take 1 tablet by mouth daily. Patient not taking: Reported on 12/09/2014 10/19/14   Allie Bossier, MD  oxyCODONE-acetaminophen (PERCOCET/ROXICET) 5-325 MG per tablet Take 1-2 tablets by mouth every 4 (four)  hours as needed (for pain scale less than 7). Patient not taking: Reported on 10/19/2014 09/04/14   Bluford Main, MD   BP 135/97 mmHg  Pulse 99  Temp(Src) 97.9 F (36.6 C) (Oral)  Resp 24  Wt 254 lb 14.4 oz (115.622 kg)  SpO2 100%  LMP 11/24/2014 Physical Exam  Constitutional: She is oriented to person, place, and time. She appears well-developed and well-nourished.  HENT:  Head: Normocephalic and atraumatic.  Eyes: EOM are normal. Pupils are equal, round, and reactive to light.  Neck: Normal range of motion. Neck supple. No JVD present.  Cardiovascular: Normal  rate, regular rhythm and normal heart sounds.   No murmur heard. Pulmonary/Chest: Effort normal and breath sounds normal. She has no wheezes. She has no rales. She exhibits no tenderness.  Abdominal: Soft. Bowel sounds are normal. She exhibits no distension and no mass. There is no tenderness.  Musculoskeletal: Normal range of motion. She exhibits no edema.  Lymphadenopathy:    She has no cervical adenopathy.  Neurological: She is alert and oriented to person, place, and time. No cranial nerve deficit. She exhibits normal muscle tone. Coordination normal.  Skin: Skin is warm and dry. No rash noted.  Psychiatric: Her behavior is normal. Thought content normal.  Appears mildly depressed, intermittently tearful  Nursing note and vitals reviewed.   ED Course  Procedures   DIAGNOSTIC STUDIES: Oxygen Saturation is 100% on RA, normal by my interpretation.    COORDINATION OF CARE: 2:10 AM Discussed treatment plan with pt at bedside and pt agreed to plan.    MDM   Final diagnoses:  Anxiety  Postpartum depression    Anxiety and postpartum depression. She is given referral for community mental health services. Old records are reviewed confirming recent twin birth with perinatal demise of one of the twins.  I personally performed the services described in this documentation, which was scribed in my presence. The recorded information has been reviewed and is accurate.       Dione Booze, MD 12/09/14 [phone]

## 2015-07-24 ENCOUNTER — Emergency Department (HOSPITAL_COMMUNITY)
Admission: EM | Admit: 2015-07-24 | Discharge: 2015-07-24 | Disposition: A | Discharge status: Home / Self Care | Payer: BC Managed Care – PPO | Attending: Emergency Medicine | Admitting: Emergency Medicine

## 2015-07-24 ENCOUNTER — Emergency Department (HOSPITAL_COMMUNITY): Payer: BC Managed Care – PPO

## 2015-07-24 ENCOUNTER — Encounter (HOSPITAL_COMMUNITY): Payer: Self-pay | Admitting: Family Medicine

## 2015-07-24 DIAGNOSIS — J45901 Unspecified asthma with (acute) exacerbation: Secondary | ICD-10-CM | POA: Diagnosis not present

## 2015-07-24 DIAGNOSIS — Z8632 Personal history of gestational diabetes: Secondary | ICD-10-CM | POA: Insufficient documentation

## 2015-07-24 DIAGNOSIS — Z3202 Encounter for pregnancy test, result negative: Secondary | ICD-10-CM | POA: Insufficient documentation

## 2015-07-24 DIAGNOSIS — F419 Anxiety disorder, unspecified: Secondary | ICD-10-CM

## 2015-07-24 DIAGNOSIS — R079 Chest pain, unspecified: Secondary | ICD-10-CM | POA: Diagnosis present

## 2015-07-24 DIAGNOSIS — Z8742 Personal history of other diseases of the female genital tract: Secondary | ICD-10-CM | POA: Insufficient documentation

## 2015-07-24 DIAGNOSIS — E669 Obesity, unspecified: Secondary | ICD-10-CM | POA: Diagnosis not present

## 2015-07-24 DIAGNOSIS — Z8619 Personal history of other infectious and parasitic diseases: Secondary | ICD-10-CM | POA: Insufficient documentation

## 2015-07-24 DIAGNOSIS — Z79899 Other long term (current) drug therapy: Secondary | ICD-10-CM | POA: Diagnosis not present

## 2015-07-24 LAB — I-STAT CHEM 8, ED
BUN: 13 mg/dL (ref 6–20)
CREATININE: 0.6 mg/dL (ref 0.44–1.00)
Calcium, Ion: 1.19 mmol/L (ref 1.12–1.23)
Chloride: 102 mmol/L (ref 101–111)
Glucose, Bld: 99 mg/dL (ref 65–99)
HEMATOCRIT: 42 % (ref 36.0–46.0)
HEMOGLOBIN: 14.3 g/dL (ref 12.0–15.0)
POTASSIUM: 3.7 mmol/L (ref 3.5–5.1)
Sodium: 139 mmol/L (ref 135–145)
TCO2: 24 mmol/L (ref 0–100)

## 2015-07-24 LAB — I-STAT TROPONIN, ED: Troponin i, poc: 0 ng/mL (ref 0.00–0.08)

## 2015-07-24 LAB — I-STAT BETA HCG BLOOD, ED (MC, WL, AP ONLY)

## 2015-07-24 MED ORDER — HYDROXYZINE HCL 25 MG PO TABS: 25.0000 mg | ORAL_TABLET | Freq: Four times a day (QID) | ORAL | Status: DC

## 2015-07-24 MED ORDER — LORAZEPAM 1 MG PO TABS
1.0000 mg | ORAL_TABLET | Freq: Once | ORAL | Status: AC
  Administered 2015-07-24: 1 mg via ORAL
  Filled 2015-07-24: qty 1

## 2015-07-24 NOTE — Discharge Instructions (Signed)
Panic Attacks °Panic attacks are sudden, short-lived surges of severe anxiety, fear, or discomfort. They may occur for no reason when you are relaxed, when you are anxious, or when you are sleeping. Panic attacks may occur for a number of reasons:  °· Healthy people occasionally have panic attacks in extreme, life-threatening situations, such as war or natural disasters. Normal anxiety is a protective mechanism of the body that helps us react to danger (fight or flight response). °· Panic attacks are often seen with anxiety disorders, such as panic disorder, social anxiety disorder, generalized anxiety disorder, and phobias. Anxiety disorders cause excessive or uncontrollable anxiety. They may interfere with your relationships or other life activities. °· Panic attacks are sometimes seen with other mental illnesses, such as depression and posttraumatic stress disorder. °· Certain medical conditions, prescription medicines, and drugs of abuse can cause panic attacks. °SYMPTOMS  °Panic attacks start suddenly, peak within 20 minutes, and are accompanied by four or more of the following symptoms: °· Pounding heart or fast heart rate (palpitations). °· Sweating. °· Trembling or shaking. °· Shortness of breath or feeling smothered. °· Feeling choked. °· Chest pain or discomfort. °· Nausea or strange feeling in your stomach. °· Dizziness, light-headedness, or feeling like you will faint. °· Chills or hot flushes. °· Numbness or tingling in your lips or hands and feet. °· Feeling that things are not real or feeling that you are not yourself. °· Fear of losing control or going crazy. °· Fear of dying. °Some of these symptoms can mimic serious medical conditions. For example, you may think you are having a heart attack. Although panic attacks can be very scary, they are not life threatening. °DIAGNOSIS  °Panic attacks are diagnosed through an assessment by your health care provider. Your health care provider will ask  questions about your symptoms, such as where and when they occurred. Your health care provider will also ask about your medical history and use of alcohol and drugs, including prescription medicines. Your health care provider may order blood tests or other studies to rule out a serious medical condition. Your health care provider may refer you to a mental health professional for further evaluation. °TREATMENT  °· Most healthy people who have one or two panic attacks in an extreme, life-threatening situation will not require treatment. °· The treatment for panic attacks associated with anxiety disorders or other mental illness typically involves counseling with a mental health professional, medicine, or a combination of both. Your health care provider will help determine what treatment is best for you. °· Panic attacks due to physical illness usually go away with treatment of the illness. If prescription medicine is causing panic attacks, talk with your health care provider about stopping the medicine, decreasing the dose, or substituting another medicine. °· Panic attacks due to alcohol or drug abuse go away with abstinence. Some adults need professional help in order to stop drinking or using drugs. °HOME CARE INSTRUCTIONS  °· Take all medicines as directed by your health care provider.   °· Schedule and attend follow-up visits as directed by your health care provider. It is important to keep all your appointments. °SEEK MEDICAL CARE IF: °· You are not able to take your medicines as prescribed. °· Your symptoms do not improve or get worse. °SEEK IMMEDIATE MEDICAL CARE IF:  °· You experience panic attack symptoms that are different than your usual symptoms. °· You have serious thoughts about hurting yourself or others. °· You are taking medicine for panic attacks and   have a serious side effect. °MAKE SURE YOU: °· Understand these instructions. °· Will watch your condition. °· Will get help right away if you are not  doing well or get worse. °  °This information is not intended to replace advice given to you by your health care provider. Make sure you discuss any questions you have with your health care provider. °  °Document Released: 06/10/2005 Document Revised: 06/15/2013 Document Reviewed: 01/22/2013 °Elsevier Interactive Patient Education ©2016 Elsevier Inc. ° ° ° ° °Emergency Department Resource Guide °1) Find a Doctor and Pay Out of Pocket °Although you won't have to find out who is covered by your insurance plan, it is a good idea to ask around and get recommendations. You will then need to call the office and see if the doctor you have chosen will accept you as a new patient and what types of options they offer for patients who are self-pay. Some doctors offer discounts or will set up payment plans for their patients who do not have insurance, but you will need to ask so you aren't surprised when you get to your appointment. ° °2) Contact Your Local Health Department °Not all health departments have doctors that can see patients for sick visits, but many do, so it is worth a call to see if yours does. If you don't know where your local health department is, you can check in your phone book. The CDC also has a tool to help you locate your state's health department, and many state websites also have listings of all of their local health departments. ° °3) Find a Walk-in Clinic °If your illness is not likely to be very severe or complicated, you may want to try a walk in clinic. These are popping up all over the country in pharmacies, drugstores, and shopping centers. They're usually staffed by nurse practitioners or physician assistants that have been trained to treat common illnesses and complaints. They're usually fairly quick and inexpensive. However, if you have serious medical issues or chronic medical problems, these are probably not your best option. ° °No Primary Care Doctor: °- Call [facility] at  832-8000 -  they can help you locate a primary care doctor that  accepts your insurance, provides certain services, etc. °- Physician Referral Service- 1-800-533-3463 ° °Chronic Pain Problems: °Organization         Address  Phone   Notes  °[facility]  (336) 297-2271 Patients need to be referred by their primary care doctor.  ° °Medication Assistance: °Organization         Address  Phone   Notes  °[place] [facility] [address] °[place], [place] [address] (336) 641-8030 --Must be a resident of [place] °-- Must have NO insurance coverage whatsoever (no Medicaid/ Medicare, etc.) °-- The pt. MUST have a primary care doctor that directs their care regularly and follows them in the community °  °[facility]  (866) 331-1348   °[employer]  (888) 892-1162   ° °Agencies that provide inexpensive medical care: °Organization         Address  Phone   Notes  °[facility]  (336) 832-8035   °[facility]    (336) 832-7272   °[facility] [address] °[place],  [address] (336) 832-4777   °[facility] [address], °[place] (336) 271-4999   °[facility]    (336) 373-0678   °  [facility]    (336) 272-1050   °[facility] ° [address], [place] Phone:  (336) 832-4444, Fax:  (336) 832-4440 Hours of Operation:  9 am - 6 pm, M-F.  Also accepts Medicaid/Medicare and self-pay.  °[facility] ° [address], [place] Phone: (336) 832-3150, Fax: (336) 832-3151. Hours of Operation:  8:30 am - 5:30 pm, M-F.  Also accepts Medicaid and self-pay.  °[facility] [place] [address], [place] Phone: (336) 878-6027   °[facility] [address], [place], [place] (336)723-1848, Ext. 123 Mondays & Thursdays: 7-9 AM.  First 15 patients are seen on a first come, first serve basis. °  ° °Medicaid-accepting  [place] Providers: ° °Organization         Address  Phone   Notes  °[facility] [address], [place] (336) 641-2100 Also accepts self-pay patients.  °[facility] [address], [place] ° (336) 856-9996   °[facility] [address], [place] (336) 288-8857   °[facility] [address] [place] [address], [place] (336) 299-7000   °Veita Bland [address], [place]  ° (336) 373-1557 Only accepts [place] Access Medicaid patients after they have their name applied to their card.  ° °Self-Pay (no insurance) in [place]: ° °Organization         Address  Phone   Notes  °Sickle Cell Patients, [facility] [address], [place] (336) 832-1970   °[facility] [address], [place] (336) 832-4400   °[facility] [place] ° [address], [place] (336) 992-4800   °[facility]/Dr. Osei-Bonsu ° [address] [place] [address], [place] or [address], [place] (336) 841-8500 Phone number for both [place] and [place] locations is the same.  °[facility] [address], [place] (336) 299-0000   °[facility] [address] [place] [address], [place] or [address] (336) 852-7530 °(336) 878-2260   °[facility] [address], [place] (336) 350-1642, phone; (336) 294-5005, fax Sees patients 1st and 3rd Saturday of every month.  Must not qualify for public or private insurance (i.e. Medicaid, Medicare, [place] Health Choice, Veterans' Benefits) • Household income should be no more than 200% of the poverty level •The clinic cannot treat you if you are pregnant or think you are pregnant • Sexually transmitted diseases are not treated at the clinic.  ° ° °Dental Care: °Organization         Address  Phone  Notes  °[place] [facility] [address],  (336) 641-6152 Accepts children up to age 21 who are enrolled in Medicaid or [place] Health Choice; pregnant women with a Medicaid card; and children who have applied for Medicaid or [place] Health Choice, but were declined, whose parents can pay a reduced fee at time of service.  °[facility] [place]  [address], [place] (336) 641-7733 Accepts children up to age 21 who are enrolled in Medicaid or [place] Health Choice; pregnant women with a Medicaid card; and children who have applied for Medicaid or  Health Choice, but were declined, whose parents can pay a reduced fee at time   of service.  °[facility] ° [address], [place] (336) 641-4533 Patients are seen by appointment only. Walk-ins are not accepted. [facility] will see patients 18 years of age and older. °Monday - Tuesday (8am-5pm) °Most Wednesdays (8:30-5pm) °$30 per visit, cash only  °[facility] ° [address], [place] (336) 641-4533 Patients are seen by appointment only. Walk-ins are not accepted. [facility] will see patients 18 years of age and older. °One Wednesday Evening (Monthly: Volunteer Based).  $30 per visit, cash only  °[facility]  (919) 537-3737 for adults; Children under age 4, call Graduate Pediatric Dentistry at ([phone]) [phone]. Children aged 4-14, please call (919) [phone] to request a pediatric application. ° Dental services are provided in all areas of dental care including fillings, crowns and bridges, complete and partial dentures, implants, gum treatment, root canals, and extractions. Preventive care is also provided. Treatment is provided to both adults and children. °Patients are selected via a lottery and there is often a waiting list. °  °[facility] [address], °[place] ° (336) 763-8833 [email or web address] °  °[facility] [address], [place], [place] (336)723-1848, Ext. 123 Second and Fourth Thursday of each month, opens at 6:30 AM; Clinic ends at 9 AM.  Patients are seen on a first-come first-served basis, and a limited number are seen during each clinic.  ° °[facility] ° [address], [place], [place] (336) 723-7904   Eligibility Requirements °You must have lived in [place], [place], or [place] counties for at least the last three months. °  You cannot be eligible for state or federal sponsored healthcare insurance, including Veterans Administration, Medicaid, or Medicare. °  You generally cannot be eligible for healthcare insurance through your employer.  °  How to apply: °Eligibility screenings are held every Tuesday and Wednesday afternoon from 1:00 pm until 4:00 pm. You do not need an appointment for the interview!  °[facility] [address], [place], [place] 336-631-2330   °[facility]  336-342-8273   °[facility]  336-703-3100   °[facility]  336-570-6415   ° °Behavioral Health Resources in the Community: °Intensive Outpatient Programs °Organization         Address  Phone  Notes  °[facility] [address], [place], [place] 336-878-6098   °[facility] [address], [place], [place] 336-832-9800   °[facility] [address], [place], [place] ° 336-882-2125   °[facility] [address],  °[place], [place] 1-800-853-5163 or 336-641-4981   °Substance Abuse Resources °Organization         Address  Phone  Notes  °[facility]  336-882-2125   °[facility]  336-784-9470   °[facility]  336-285-9073   °[facility]  336-845-3988   °Residential & Outpatient Substance Abuse Program  1-800-659-3381   °Psychological Services °Organization         Address  Phone  Notes  °[facility]   336- 832-9600   °[facility]  336- 378-7881   °[facility] [address], [place] 1-800-853-5163 or 336-641-4981   ° °Mobile Crisis Teams °Organization         Address  Phone  Notes  °[facility], Mobile Crisis Care Unit  1-877-626-1772   °Assertive °Psychotherapeutic   Services ° [address] [place], [place] 336-834-9664   °Sharon DeEsch [address] °[place] 336-554-5454   ° °Self-Help/Support Groups °Organization         Address  Phone             Notes  °[facility] - variety of support groups  336- 373-1402 Call for more information  °Narcotics Anonymous (NA), [facility] [address], °[place]  2 meetings at this location  ° °Residential Treatment Programs °Organization         Address  Phone  Notes  °[facility] [address],    °[place]  1-866-801-8205   °[facility] ° [address], [place], [place] 704-293-8524   °[facility] [address], [place] 336-845-3988 Admissions: 8am-3pm M-F  °[facility] [address]    °[place], [place] 336-841-1104   °[facility] [address], [place], [place] 336-379-7146   °[facility] [address]  °[place], [place] 336-285-9073   °[facility] Intensive Outpatient [address], [place], [place] 336-852-3033   °[facility] [address]  °[place], [place] 1-877-615-2722 or 336-784-9470   °[facility] [address] [place], [place] 336-227-7417 Accepts Medicaid  °[facility] [address]  ° [place] 1-800-659-3381 Substance Abuse/Addiction Treatment  ° °[place] Behavioral Health Resources °Organization         Address  Phone  Notes  °[facility]  (888) 581-9988   °Julie Brannon, PhD [address] [place], [place]   (336) 349-5553 or  (336) 951-0000   °[facility]   [address] °[place], [place] (336) 349-4454   °[facility] [address], [place], [place] (336) 342-8316 Insurance/Medicaid/sponsorship through [facility]  °[facility] [address]                                    [place], [place] (336) 342-8316 Therapy/tele-psych/case  °[facility] [address]  ° [place], [place] (336) 349-2233    °Dr. Arfeen  (336) 349-4544   °[facility] [place]  [employer] [place] [facility] 1) [address], [place] °2) [address], [place] °3)  [address], [place] (336) 349-3220 °(336) 342-7768 ° °(336) 342-8140   °[place] [facility] (336) 342-1394 or (336) 342-3537 (After Hours)    ° ° ° °

## 2015-07-24 NOTE — ED Provider Notes (Signed)
CSN: [id number]     Arrival date & time 07/24/15  1110 History   First MD Initiated Contact with Patient 07/24/15 1127     Chief Complaint  Patient presents with  . Chest Pain  . Shortness of Breath     Patient is a 25 y.o. female presenting with chest pain and shortness of breath. The history is provided by the patient. No language interpreter was used.  Chest Pain Associated symptoms: shortness of breath   Shortness of Breath Associated symptoms: chest pain    Krystal Nguyen is a 25 y.o. female who presents to the Emergency Department complaining of chest pain/sob.  She reports one month of chest pain and sob, worse in the mornings.  Pain is described as a discomfort.  She states that it feels like anxiety/panic and is worse when she goes to work.  She had a twin pregnancy and delivered last March.  She lost one of the twins due to medical illness.  She denies fevers, vomiting, abdominal pain, leg swelling or pain.  She does not take any hormone medications.    Past Medical History  Diagnosis Date  . Miscarriage   . Obesity   . Asthma     childhood  . Gestational diabetes   . Genital herpes    Past Surgical History  Procedure Laterality Date  . No past surgeries    . Cesarean section N/A 08/31/2014    Procedure: CESAREAN SECTION;  Surgeon: Lazaro Arms, MD;  Location: [facility];  Service: Obstetrics;  Laterality: N/A;   Family History  Problem Relation Age of Onset  . Diabetes Maternal Grandmother   . Hypertension Maternal Grandmother   . Cancer Maternal Grandfather   . Diabetes Father    Social History  Substance Use Topics  . Smoking status: Never Smoker   . Smokeless tobacco: Never Used  . Alcohol Use: No     Comment: social   OB History    Gravida Para Term Preterm AB TAB SAB Ectopic Multiple Living   Review of Systems  Respiratory: Positive for shortness of breath.   Cardiovascular: Positive for chest pain.  All other systems reviewed and  are negative.     Allergies  Bactrim  Home Medications   Prior to Admission medications   Medication Sig Start Date End Date Taking? Authorizing Provider  Ferrous Sulfate (SLOW FE PO) Take 1 tablet by mouth daily.    Historical Provider, MD  hydrOXYzine (ATARAX/VISTARIL) 25 MG tablet Take 1 tablet (25 mg total) by mouth every 6 (six) hours. 07/24/15   Tilden Fossa, MD  ibuprofen (ADVIL,MOTRIN) 600 MG tablet Take 1 tablet (600 mg total) by mouth every 6 (six) hours. Patient not taking: Reported on 10/19/2014 09/04/14   Bluford Main, MD  norgestrel-ethinyl estradiol (LO/OVRAL,CRYSELLE) 0.3-30 MG-MCG tablet Take 1 tablet by mouth daily. Patient not taking: Reported on 12/09/2014 10/19/14   Allie Bossier, MD  oxyCODONE-acetaminophen (PERCOCET/ROXICET) 5-325 MG per tablet Take 1-2 tablets by mouth every 4 (four) hours as needed (for pain scale less than 7). Patient not taking: Reported on 10/19/2014 09/04/14   Bluford Main, MD   BP 112/94 mmHg  Pulse 89  Temp(Src) 98 F (36.7 C) (Oral)  Resp 19  SpO2 98%  LMP 07/19/2015 Physical Exam  Constitutional: She is oriented to person, place, and time. She appears well-developed and well-nourished.  HENT:  Head: Normocephalic and atraumatic.  Cardiovascular: Normal rate and regular rhythm.   No murmur heard. Pulmonary/Chest: Effort normal and breath sounds normal. No respiratory distress. She exhibits tenderness.  tachypneic  Abdominal: Soft. There is no tenderness. There is no rebound and no guarding.  Musculoskeletal: She exhibits no edema or tenderness.  Neurological: She is alert and oriented to person, place, and time.  Skin: Skin is warm and dry.  Psychiatric:  Anxious appearing  Nursing note and vitals reviewed.   ED Course  Procedures (including critical care time) Labs Review Labs Reviewed  I-STAT TROPOININ, ED  I-STAT BETA HCG BLOOD, ED ([facility], [facility], [facility] ONLY)  I-STAT CHEM 8, ED    Imaging Review Dg Chest 2 View  07/24/2015   CLINICAL DATA:  Mid chest pain starting this morning. Some shortness of breath. EXAM: CHEST  2 VIEW COMPARISON:  None. FINDINGS: Cardiomediastinal silhouette is normal in size and configuration. Lungs are clear. Lung volumes are normal. No evidence of pneumonia. No pleural effusion. No pneumothorax. Osseous and soft tissue structures about the chest are unremarkable. IMPRESSION: Normal chest x-ray. Electronically Signed   By: Bary Richard M.D.   On: 07/24/2015 12:31   I have personally reviewed and evaluated these images and lab results as part of my medical decision-making.   EKG Interpretation   Date/Time:  Monday July 24 2015 11:24:02 EST Ventricular Rate:  72 PR Interval:  188 QRS Duration: 78 QT Interval:  378 QTC Calculation: 413 R Axis:   41 Text Interpretation:  Normal sinus rhythm Nonspecific T wave abnormality  Abnormal ECG No significant change since last tracing Confirmed by Lincoln Brigham [phone]) on 07/24/2015 11:24:06 AM      MDM   Final diagnoses:  Anxiety    Patient here for violation of chest pain or shortness of breath for the last month. History presentation is consistent with acute anxiety. On repeat evaluation following Ativan she is comfortable and feeling improved. Presentation is not consistent with PE she is perk negative. History presentation is not consistent with ACS, dissection, pneumonia. Discussed with the patient outpatient follow-up as well as home care and return precautions.  Tilden Fossa, MD 07/24/15 1558

## 2015-07-24 NOTE — ED Notes (Signed)
Pt here for chest pain that started this am. sts some SOB. Pt hx of anxiety.

## 2015-07-24 NOTE — ED Notes (Signed)
Patient transported to X-ray 

## 2015-07-24 NOTE — ED Notes (Signed)
Pt back from x-ray.

## 2015-08-13 IMAGING — US US OB TRANSVAGINAL
1 series · 14 of 28 positions shown · non-contrast
Comparison: None.

CLINICAL DATA: Abdominal pain.  Possible pregnancy.

EXAM:
OBSTETRIC <14 WK US AND TRANSVAGINAL OB US
TECHNIQUE: Both transabdominal and transvaginal ultrasound examinations were
performed for complete evaluation of the gestation as well as the
maternal uterus, adnexal regions, and pelvic cul-de-sac.
Transvaginal technique was performed to assess early pregnancy.

[Series 1: us ob transvaginal · 0.22mm/px · 14 of 54 slices shown]
[im 2/54]
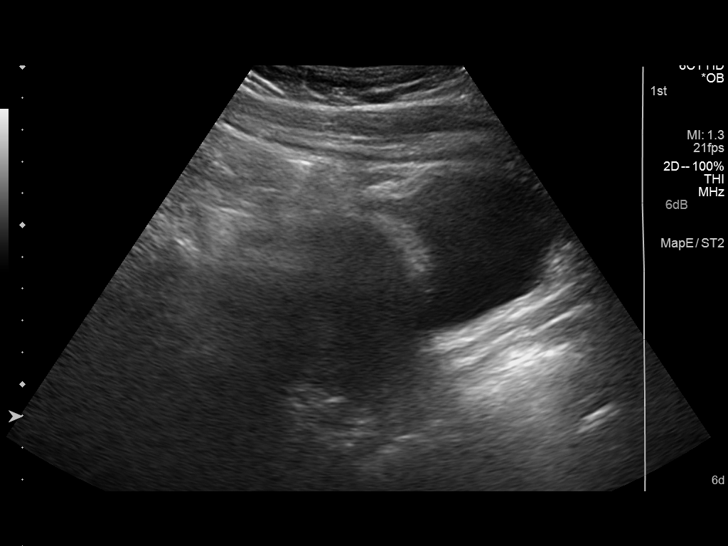
[im 6/54]
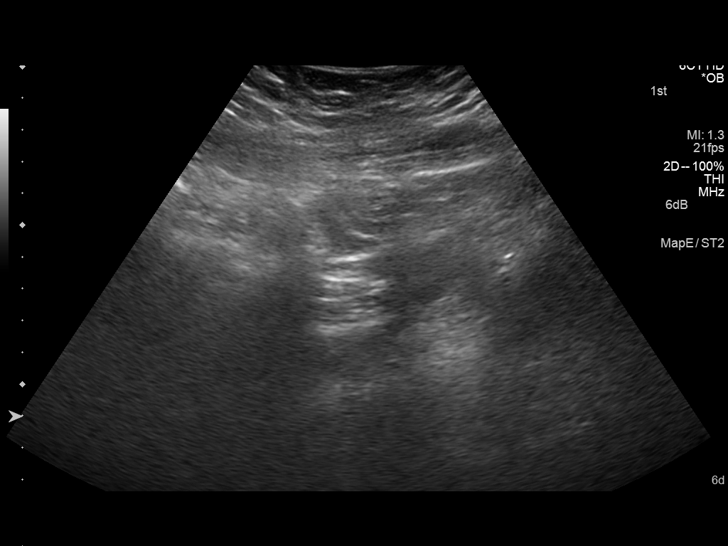
[im 10/54]
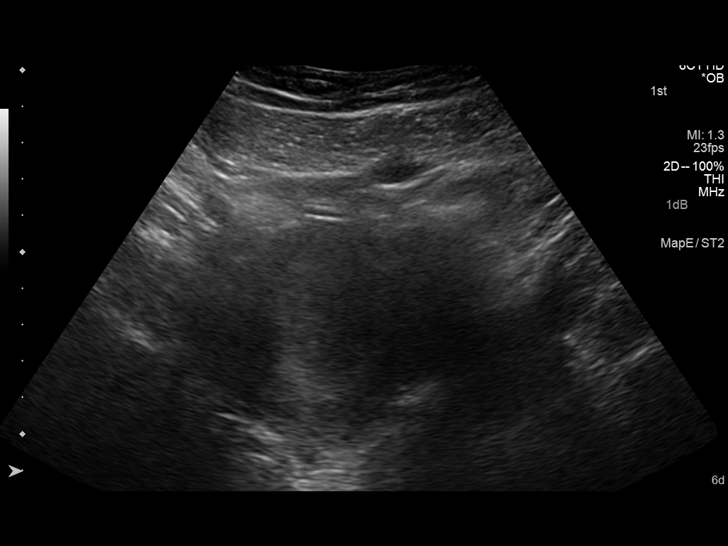
[im 14/54]
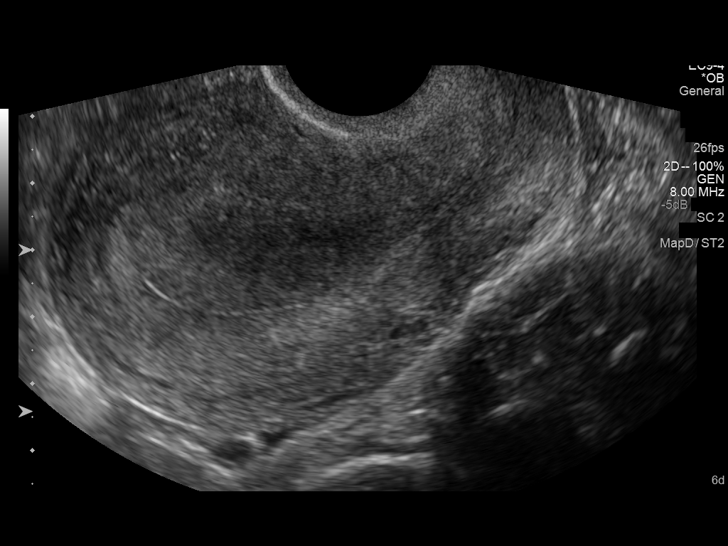
[im 18/54]
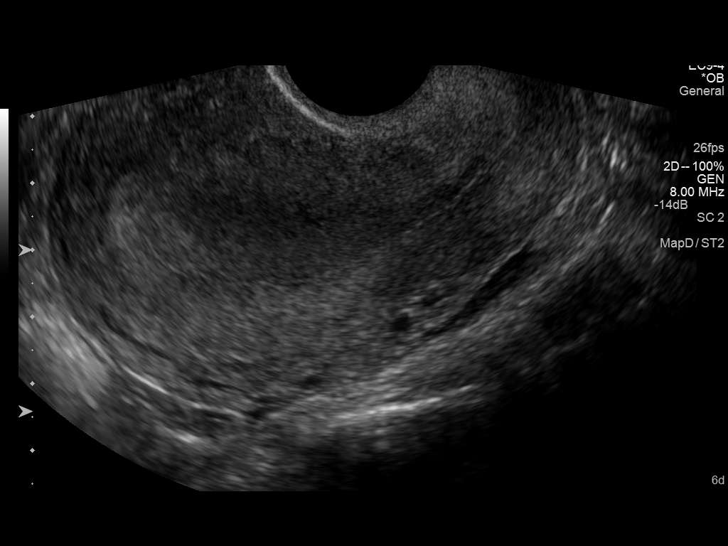
[im 22/54]
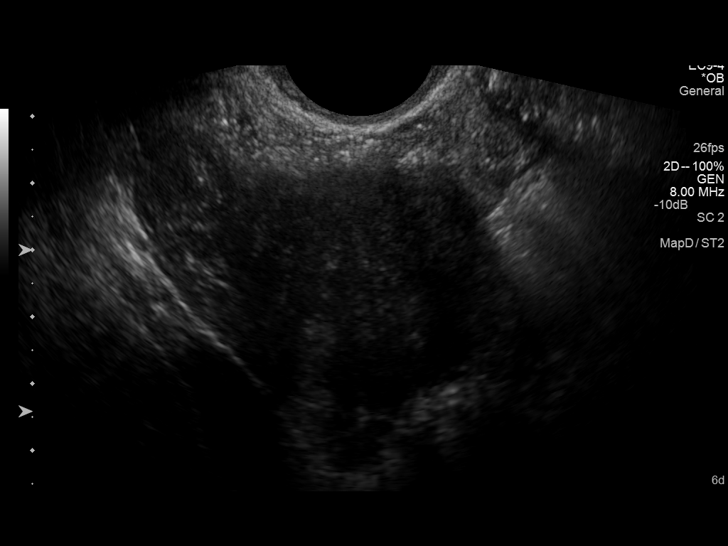
[im 26/54]
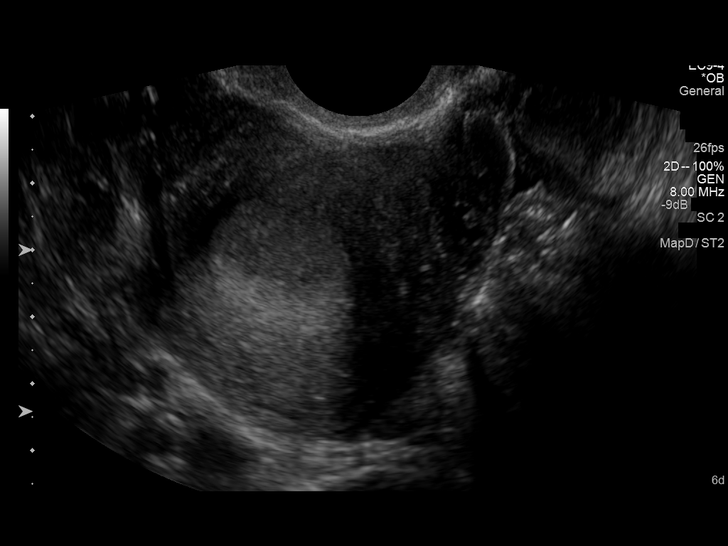
[im 30/54]
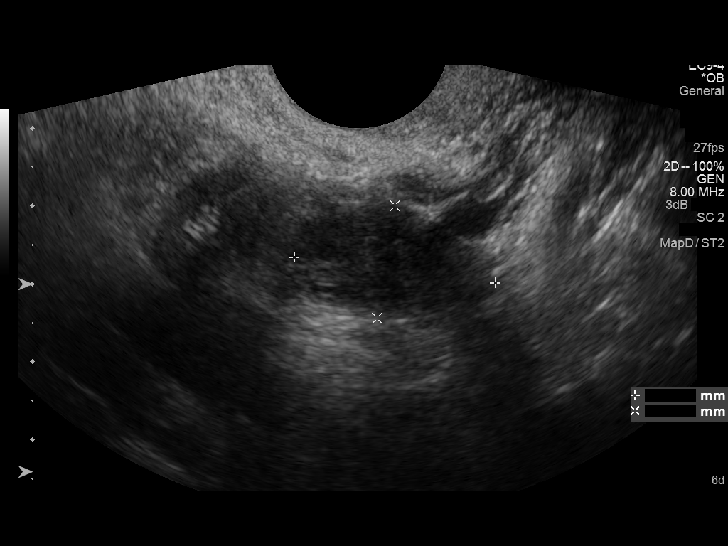
[im 34/54]
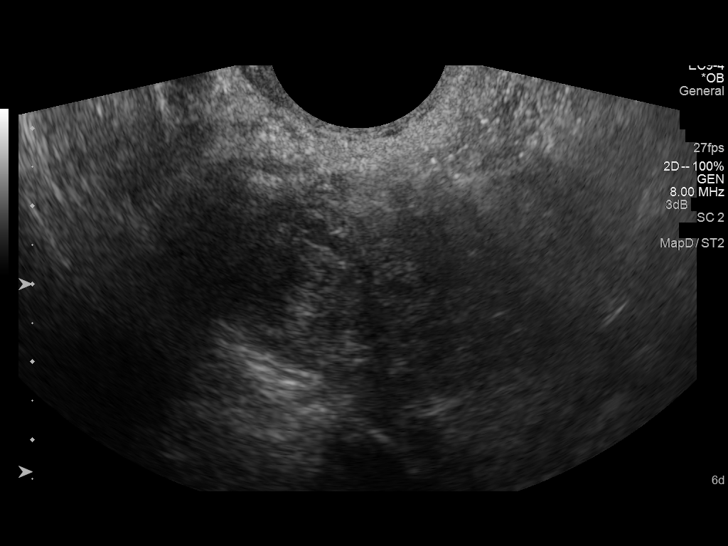
[im 38/54]
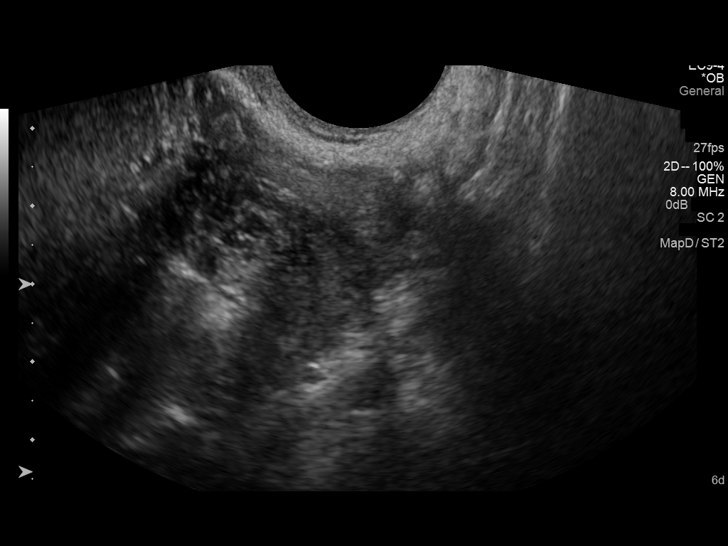
[im 42/54]
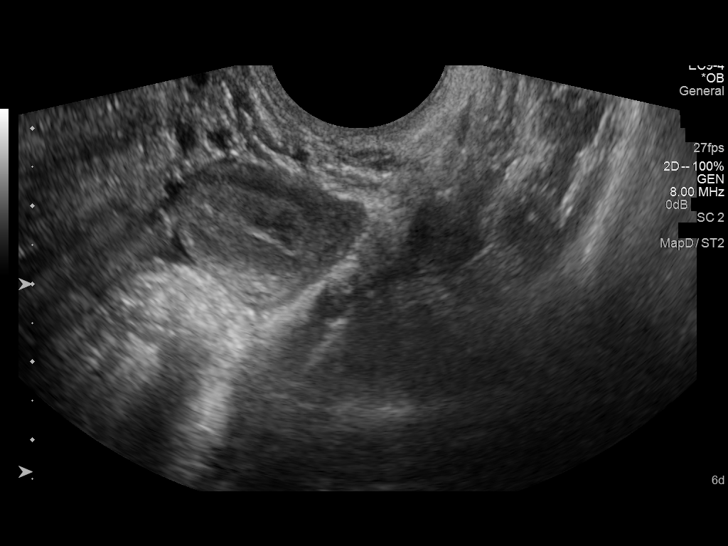
[im 46/54]
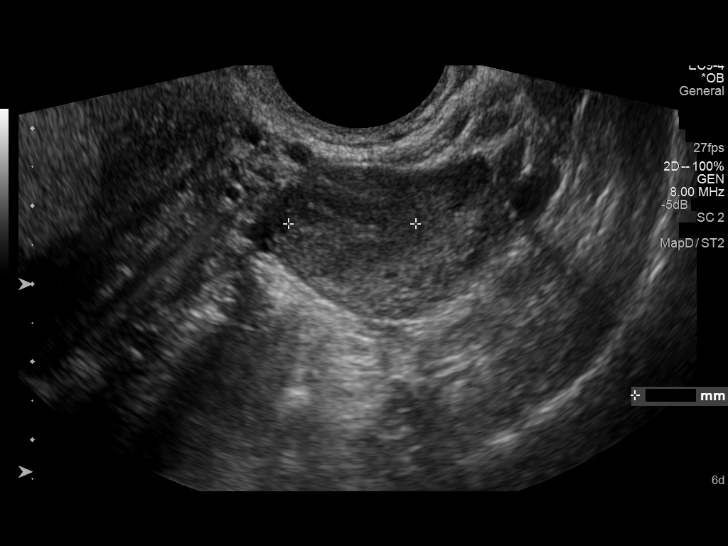
[im 50/54]
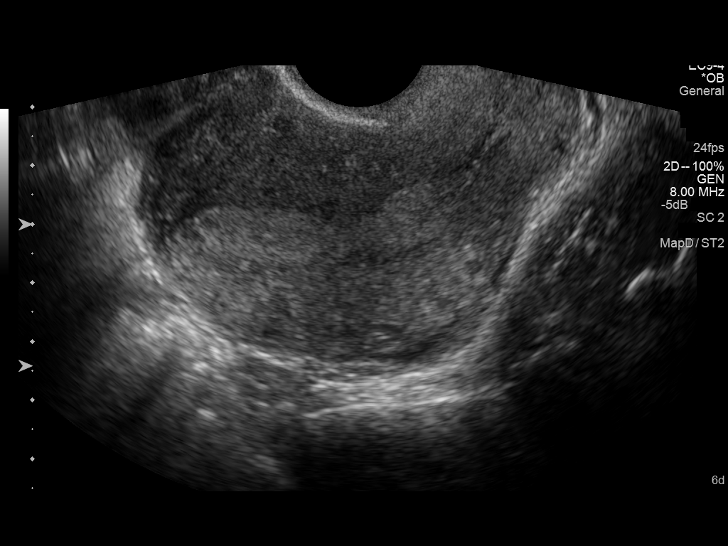
[im 54/54]
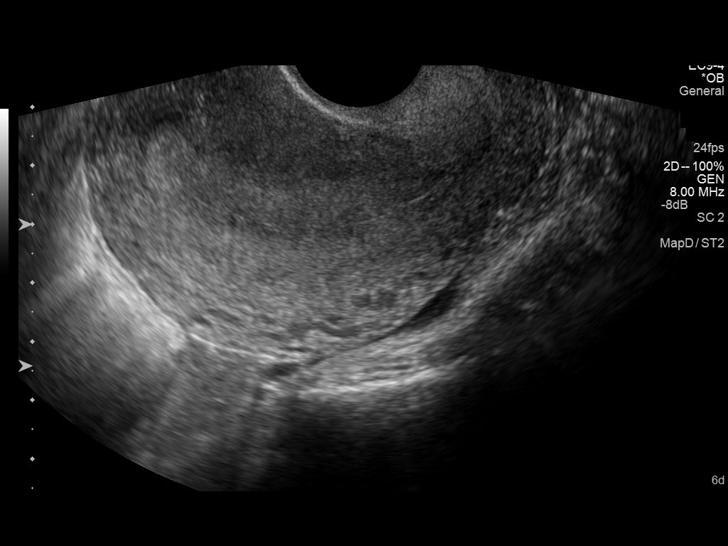

[14 of 28 positions shown; findings below may reference images not displayed]

FINDINGS: No intra or extrauterine gestational sac identified. No adnexal mass
or complex pelvic fluid. The uterus is unremarkable. The ovaries are
normal. A corpus luteum is noted on the left.
IMPRESSION: Pregnancy of unknown location. Differential considerations include
intrauterine gestation too early to be sonographically visualized,
spontaneous abortion, or ectopic pregnancy. Consider follow-up
ultrasound in 10 days and serial quantitative beta HCG follow-up.

## 2015-11-18 ENCOUNTER — Encounter (HOSPITAL_COMMUNITY): Payer: Self-pay | Admitting: *Deleted

## 2015-11-18 ENCOUNTER — Emergency Department (HOSPITAL_COMMUNITY): Payer: Medicaid Other

## 2015-11-18 ENCOUNTER — Emergency Department (HOSPITAL_COMMUNITY)
Admission: EM | Admit: 2015-11-18 | Discharge: 2015-11-18 | Disposition: A | Discharge status: Home / Self Care | Payer: Medicaid Other | Attending: Emergency Medicine | Admitting: Emergency Medicine

## 2015-11-18 DIAGNOSIS — E669 Obesity, unspecified: Secondary | ICD-10-CM | POA: Diagnosis not present

## 2015-11-18 DIAGNOSIS — R079 Chest pain, unspecified: Secondary | ICD-10-CM | POA: Diagnosis present

## 2015-11-18 DIAGNOSIS — R0789 Other chest pain: Secondary | ICD-10-CM | POA: Diagnosis not present

## 2015-11-18 DIAGNOSIS — Z79899 Other long term (current) drug therapy: Secondary | ICD-10-CM | POA: Diagnosis not present

## 2015-11-18 DIAGNOSIS — J45909 Unspecified asthma, uncomplicated: Secondary | ICD-10-CM | POA: Insufficient documentation

## 2015-11-18 DIAGNOSIS — Z6841 Body Mass Index (BMI) 40.0 and over, adult: Secondary | ICD-10-CM | POA: Diagnosis not present

## 2015-11-18 DIAGNOSIS — M791 Myalgia: Secondary | ICD-10-CM | POA: Insufficient documentation

## 2015-11-18 DIAGNOSIS — M7918 Myalgia, other site: Secondary | ICD-10-CM

## 2015-11-18 LAB — I-STAT CHEM 8, ED
BUN: 14 mg/dL (ref 6–20)
CALCIUM ION: 1.16 mmol/L (ref 1.12–1.23)
CHLORIDE: 103 mmol/L (ref 101–111)
CREATININE: 0.8 mg/dL (ref 0.44–1.00)
GLUCOSE: 124 mg/dL — AB (ref 65–99)
HCT: 39 % (ref 36.0–46.0)
Hemoglobin: 13.3 g/dL (ref 12.0–15.0)
Potassium: 4.7 mmol/L (ref 3.5–5.1)
Sodium: 140 mmol/L (ref 135–145)
TCO2: 26 mmol/L (ref 0–100)

## 2015-11-18 LAB — I-STAT TROPONIN, ED: Troponin i, poc: 0 ng/mL (ref 0.00–0.08)

## 2015-11-18 MED ORDER — ACETAMINOPHEN 325 MG PO TABS
650.0000 mg | ORAL_TABLET | Freq: Once | ORAL | Status: AC
  Administered 2015-11-18: 650 mg via ORAL
  Filled 2015-11-18: qty 2

## 2015-11-18 NOTE — Discharge Instructions (Signed)
Please read and follow all provided instructions.  Your diagnoses today include:  1. Chest wall pain    Tests performed today include:  An EKG of your heart  A chest x-ray  Cardiac enzymes - a blood test for heart muscle damage  Blood counts and electrolytes  Vital signs. See below for your results today.   Medications prescribed:   Take any prescribed medications only as directed.  You can use Ibuprofen 400mg  combined with Tylenol 1000mg  for pain relief every 6 hours. Do not exceed 4g of Tylenol in one 24 hour period. Do not take for greater than 10 days.   Follow-up instructions: Please follow-up with your primary care provider as soon as you can for further evaluation of your symptoms.   Return instructions:  SEEK IMMEDIATE MEDICAL ATTENTION IF:  You have severe chest pain, especially if the pain is crushing or pressure-like and spreads to the arms, back, neck, or jaw, or if you have sweating, nausea (feeling sick to your stomach), or shortness of breath. THIS IS AN EMERGENCY. Don't wait to see if the pain will go away. Get medical help at once. Call 911 or 0 (operator). DO NOT drive yourself to the hospital.   Your chest pain gets worse and does not go away with rest.   You have an attack of chest pain lasting longer than usual, despite rest and treatment with the medications your caregiver has prescribed.   You wake from sleep with chest pain or shortness of breath.  You feel dizzy or faint.  You have chest pain not typical of your usual pain for which you originally saw your caregiver.   You have any other emergent concerns regarding your health.  Additional Information: Chest pain comes from many different causes. Your caregiver has diagnosed you as having chest pain that is not specific for one problem, but does not require admission.  You are at low risk for an acute heart condition or other serious illness.   Your vital signs today were: BP 123/87 mmHg   Pulse  81   Temp(Src) 97.5 F (36.4 C) (Oral)   Resp 17   Ht 5\' 5"  (1.651 m)   Wt 117.935 kg   BMI 43.27 kg/m2   SpO2 100%   LMP 11/06/2015 If your blood pressure (BP) was elevated above 135/85 this visit, please have this repeated by your doctor within one month. --------------

## 2015-11-18 NOTE — ED Provider Notes (Signed)
CSN: [id number]     Arrival date & time 11/18/15  [id number] History   First MD Initiated Contact with Patient 11/18/15 0630     Chief Complaint  Patient presents with  . Chest Pain   (Consider location/radiation/quality/duration/timing/severity/associated sxs/prior Treatment) HPI 25 y.o. female presents to the Emergency Department today complaining of left sided chest pain since yesterday morning. No radiation. States that it has been constant since then. Notes sharp/aching feeling that is 8/10. Tried Ibuprofen with minimal improvement. Pt does not have pain while at rest. Pain only occurs with movement. Of note, pt was lifting groceries earlier in the week and thinks she may have strained a muscle around that time. Pt does have history of chest pain with previous visits on 12-09-14 and 07-24-15 that was attributed to anxiety from miscarriage of twins. No recent surgeries. No exogenous estrogen use.   Past Medical History  Diagnosis Date  . Miscarriage   . Obesity   . Asthma     childhood  . Gestational diabetes   . Genital herpes    Past Surgical History  Procedure Laterality Date  . No past surgeries    . Cesarean section N/A 08/31/2014    Procedure: CESAREAN SECTION;  Surgeon: Lazaro ArmsLuther H Eure, MD;  Location: [facility];  Service: Obstetrics;  Laterality: N/A;   Family History  Problem Relation Age of Onset  . Diabetes Maternal Grandmother   . Hypertension Maternal Grandmother   . Cancer Maternal Grandfather   . Diabetes Father    Social History  Substance Use Topics  . Smoking status: Never Smoker   . Smokeless tobacco: Never Used  . Alcohol Use: No     Comment: social   OB History    Gravida Para Term Preterm AB TAB SAB Ectopic Multiple Living   2 1  1 1  1  1 1      Review of Systems ROS reviewed and all are negative for acute change except as noted in the HPI.  Allergies  Bactrim  Home Medications   Prior to Admission medications   Medication Sig Start Date End Date  Taking? Authorizing Provider  Ferrous Sulfate (SLOW FE PO) Take 1 tablet by mouth daily.    Historical Provider, MD  hydrOXYzine (ATARAX/VISTARIL) 25 MG tablet Take 1 tablet (25 mg total) by mouth every 6 (six) hours. 07/24/15   Tilden FossaElizabeth Rees, MD  ibuprofen (ADVIL,MOTRIN) 600 MG tablet Take 1 tablet (600 mg total) by mouth every 6 (six) hours. Patient not taking: Reported on 10/19/2014 09/04/14   Bluford MainAmber Henson, MD  norgestrel-ethinyl estradiol (LO/OVRAL,CRYSELLE) 0.3-30 MG-MCG tablet Take 1 tablet by mouth daily. Patient not taking: Reported on 12/09/2014 10/19/14   Allie BossierMyra C Dove, MD  oxyCODONE-acetaminophen (PERCOCET/ROXICET) 5-325 MG per tablet Take 1-2 tablets by mouth every 4 (four) hours as needed (for pain scale less than 7). Patient not taking: Reported on 10/19/2014 09/04/14   Bluford MainAmber Henson, MD   BP 123/87 mmHg  Pulse 81  Temp(Src) 97.5 F (36.4 C) (Oral)  Resp 17  Ht 5\' 5"  (1.651 m)  Wt 117.935 kg  BMI 43.27 kg/m2  SpO2 100%  LMP 11/06/2015   Physical Exam  Constitutional: She is oriented to person, place, and time. She appears well-developed and well-nourished.  HENT:  Head: Normocephalic and atraumatic.  Eyes: EOM are normal. Pupils are equal, round, and reactive to light.  Neck: Normal range of motion. Neck supple. No tracheal deviation present.  Cardiovascular: Normal rate, regular rhythm, normal heart sounds and  intact distal pulses.   No murmur heard. Pulmonary/Chest: Effort normal. No respiratory distress. She has no wheezes. She has no rales. She exhibits tenderness. She exhibits no mass and no deformity.    TTP of anterior left chest. Pain is reproducible. No deformities visualized or palpated.    Abdominal: Soft. Normal appearance and bowel sounds are normal. There is no tenderness.  Musculoskeletal: Normal range of motion.  Neurological: She is alert and oriented to person, place, and time.  Skin: Skin is warm and dry.  Psychiatric: She has a normal mood and affect. Her  behavior is normal. Thought content normal.  Nursing note and vitals reviewed.  ED Course  Procedures (including critical care time) Labs Review Labs Reviewed  I-STAT CHEM 8, ED - Abnormal; Notable for the following:    Glucose, Bld 124 (*)    All other components within normal limits  I-STAT TROPOININ, ED   Imaging Review No results found. I have personally reviewed and evaluated these images and lab results as part of my medical decision-making.   EKG Interpretation   Date/Time:  Saturday Nov 18 2015 06:37:19 EDT Ventricular Rate:  79 PR Interval:  188 QRS Duration: 94 QT Interval:  383 QTC Calculation: 439 R Axis:   47 Text Interpretation:  Sinus rhythm Normal ECG Confirmed by POLLINA  MD,  CHRISTOPHER [phone]) on 11/18/2015 6:42:38 AM      MDM  I have reviewed and evaluated the relevant laboratory values I have reviewed and evaluated the relevant imaging studies.  I have interpreted the relevant EKG. I have reviewed the relevant previous healthcare records. I obtained HPI from historian. Patient discussed with supervising physician  ED Course:  Assessment: Pt is a 25yF presents with CP since yesterday. ACS Risk Factors: none. Patient is to be discharged with recommendation to follow up with PCP in regards to today's hospital visit. Chest pain is not likely of cardiac or pulmonary etiology d/t presentation, perc negative, VSS, no tracheal deviation, no JVD or new murmur, RRR, breath sounds equal bilaterally, EKG without acute abnormalities, negative troponin, and negative CXR. Advised to return to the ED is CP becomes exertional, associated with diaphoresis or nausea, radiates to left jaw/arm, worsens or becomes concerning in any way, CP is musculoskeletal in nature as pain is reproducible on palpation and with movement. No pain at rest. Low suspicion for PE/ACS. No recent surgeries. No exogenous estrogen use. Pt appears reliable for follow up and is agreeable to discharge.  Patient is in no acute distress. Vital Signs are stable. Patient is able to ambulate. Patient able to tolerate PO.   Disposition/Plan:  DC Home Additional Verbal discharge instructions given and discussed with patient.  Pt Instructed to f/u with PCP in the next week for evaluation and treatment of symptoms. Return precautions given Pt acknowledges and agrees with plan  Supervising Physician Gilda Crease, MD   Final diagnoses:  Chest wall pain  Musculoskeletal pain    Audry Pili, PA-C 11/18/15 0800  Gilda Crease, MD 11/20/15 Moses Manners

## 2015-11-18 NOTE — ED Notes (Addendum)
Pt c/o central and L sided chest pain onset yesterday with mild shortness of breath.  Pt called EMS yesterday for chest pain, but refused transport at that time. Pt also has R arm pain onset Thursday after attempting to carry several bags of groceries upstairs. Chest is tender on palpation.

## 2015-11-18 NOTE — ED Notes (Signed)
Pt. Cannot take pills without food.  Labs just drawn

## 2016-01-22 IMAGING — US US OB FOLLOW-UP EACH ADDL GEST (MODIFY)
1 series · 14 of 28 positions shown · non-contrast
Comparison: none

[Series 1: us ob follow-up each addl gest (modify) · 0.31mm/px · 14 of 68 slices shown]
[im 3/68]
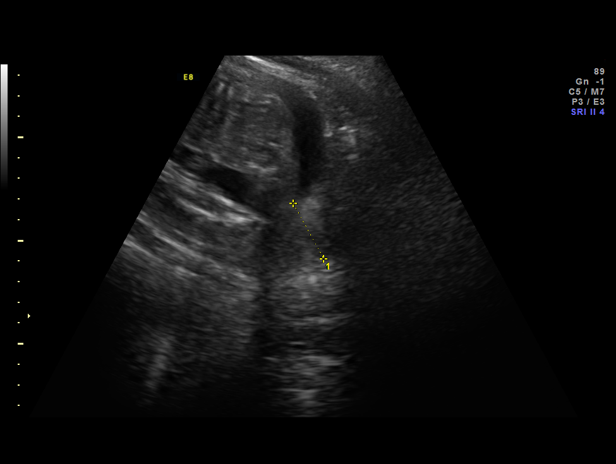
[im 8/68]
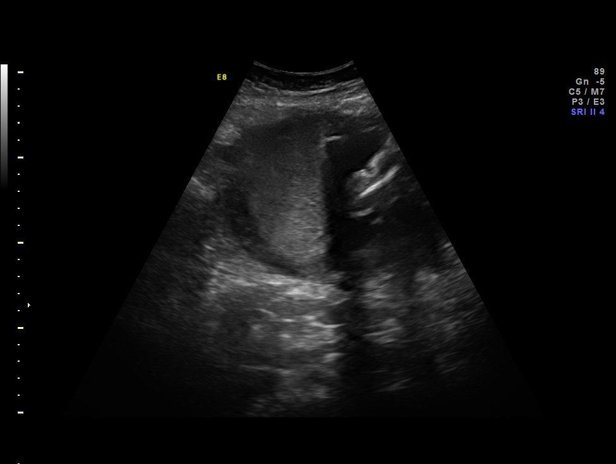
[im 13/68]
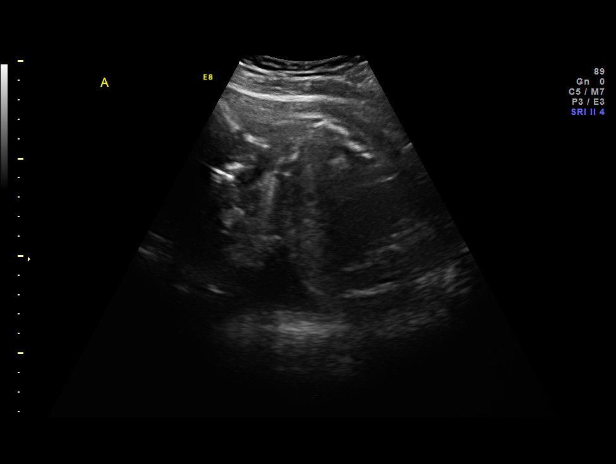
[im 18/68]
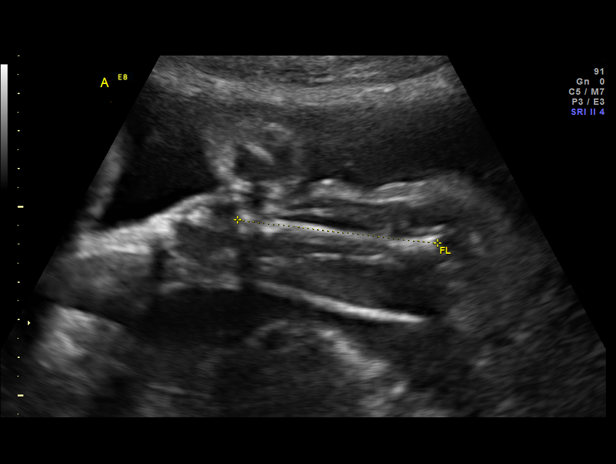
[im 23/68]
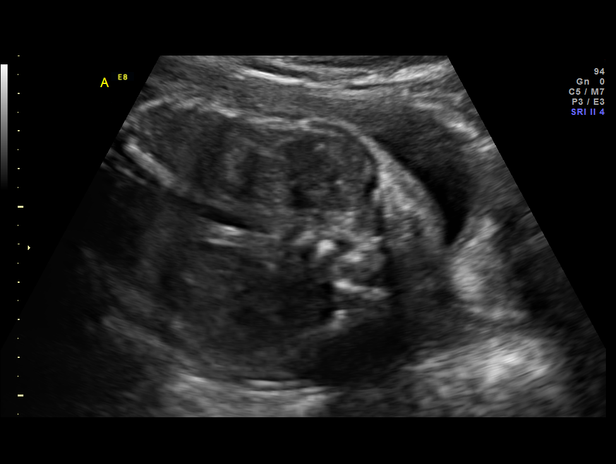
[im 28/68]
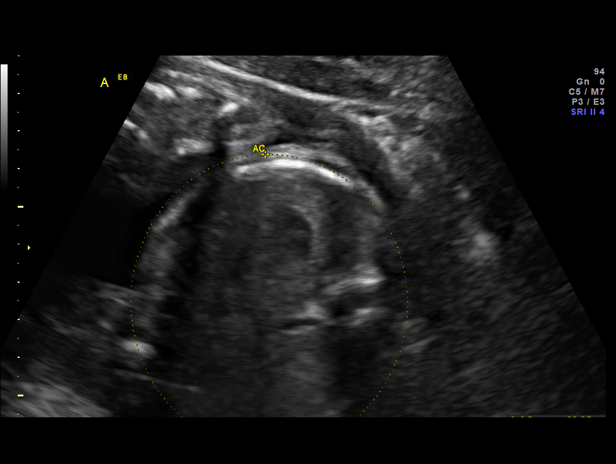
[im 33/68]
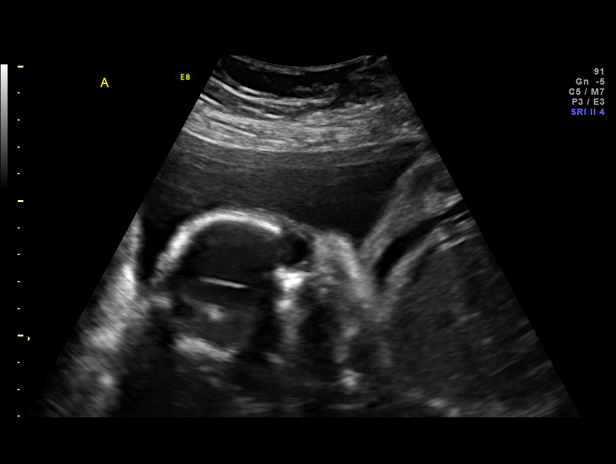
[im 38/68]
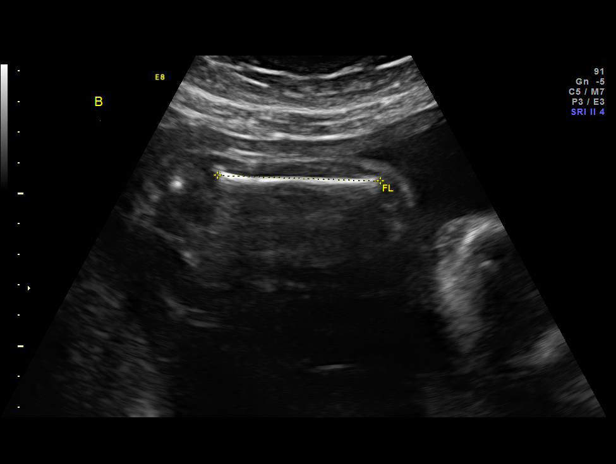
[im 43/68]
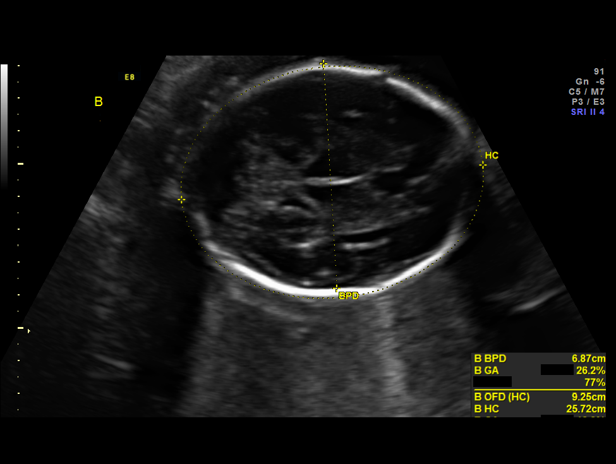
[im 48/68]
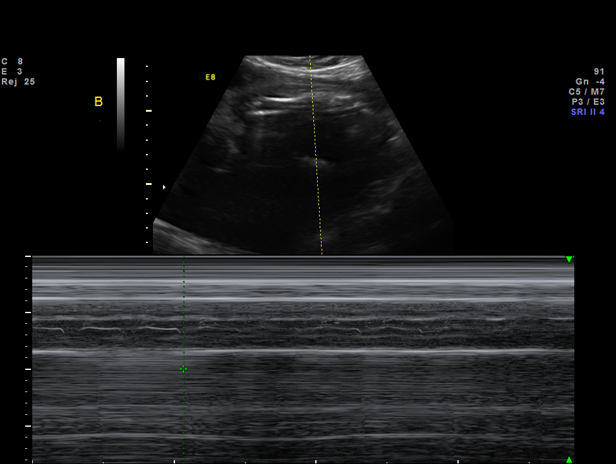
[im 53/68]
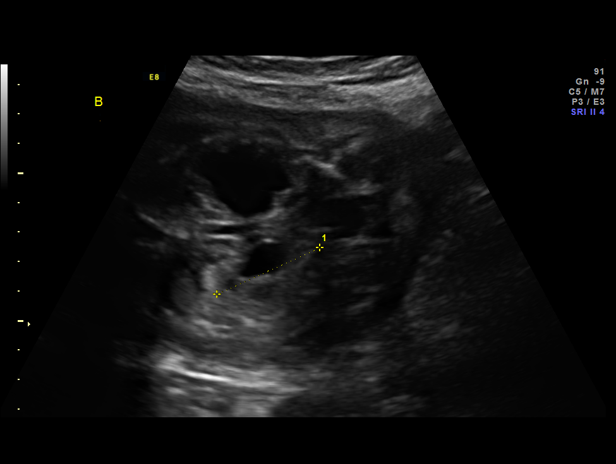
[im 58/68]
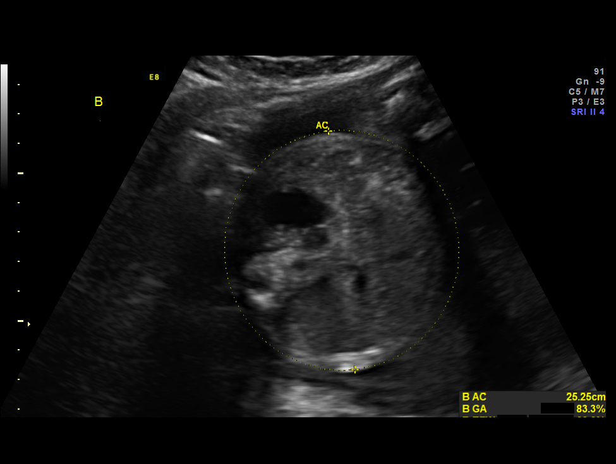
[im 63/68]
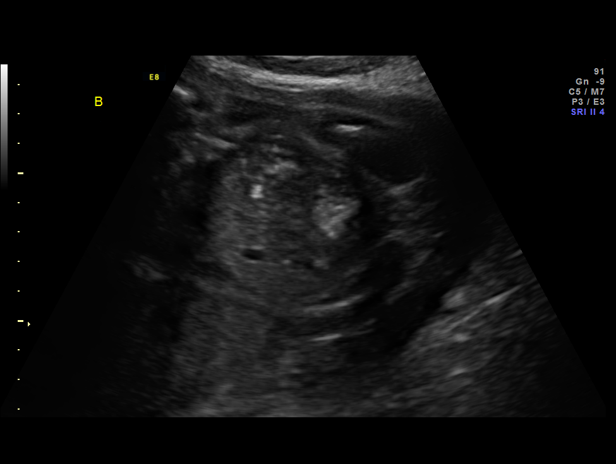
[im 68/68]
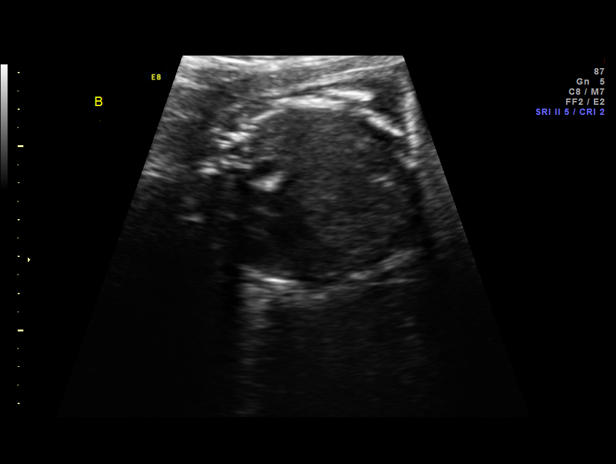

[14 of 28 positions shown; findings below may reference images not displayed]

OBSTETRICS REPORT
                      (Signed Final 07/06/2014 [DATE])

                                                         Faculty Physician
Service(s) Provided

 US OB FOLLOW UP                                       76816.1
 US OB FOLLOW UP ADDL GEST                             76816.2
Indications

 Twin gestation, Di-Di
 Twin B - Oligohydramnios / Decreased amniotic
 fluid volume
 Twin B - Fetal abnormality - other known or
 suspected (2VC, abnormal kidneys, abnormal
 heart, echogenic bowel)
 28 weeks gestation of pregnancy
Fetal Evaluation (Fetus A)

 Num Of Fetuses:    2
 Fetal Heart Rate:  165                          bpm
 Cardiac Activity:  Observed
 Fetal Lie:         Lower Fetus
 Presentation:      Breech
 Placenta:          Posterior right, above
                    cervical os
 P. Cord            Previously Visualized
 Insertion:

 Membrane Desc:     Dividing
                    Membrane seen
                    - Dichorionic.

 Amniotic Fluid
 AFI FV:      Subjectively within normal limits
                                             Larg Pckt:     4.2  cm
Biometry (Fetus A)

 BPD:     74.9  mm     G. Age:  30w 0d                CI:         79.8   70 - 86
 OFD:     93.9  mm                                    FL/HC:      19.3   18.8 -

 HC:     270.4  mm     G. Age:  29w 3d       66  %    HC/AC:      1.14   1.05 -

 AC:     237.7  mm     G. Age:  28w 0d       45  %    FL/BPD:     69.7   71 - 87
 FL:      52.2  mm     G. Age:  27w 6d       30  %    FL/AC:      22.0   20 - 24
 HUM:     46.7  mm     G. Age:  27w 4d       35  %

 Est. FW:    3327  gm    2 lb 10 oz      56  %     FW Discordancy         6  %
Gestational Age (Fetus A)

 LMP:           28w 0d        Date:  12/22/13                 EDD:   09/28/14
 U/S Today:     28w 6d                                        EDD:   09/22/14
 Best:          28w 0d     Det. By:  LMP  (12/22/13)          EDD:   09/28/14
Anatomy (Fetus A)

 Cranium:          Previously seen        Aortic Arch:      Not well visualized
 Fetal Cavum:      Previously seen        Ductal Arch:      Not well visualized
 Ventricles:       Appears normal         Diaphragm:        Appears normal
 Choroid Plexus:   Previously seen        Stomach:          Appears normal,
                                                            left sided
 Cerebellum:       Previously seen        Abdomen:          Appears normal
 Posterior Fossa:  Previously seen        Abdominal Wall:   Previously seen
 Nuchal Fold:      Previously seen        Cord Vessels:     Previously seen
 Face:             Orbits and profile     Kidneys:          Appear normal
                   previously seen
 Lips:             Previously seen        Bladder:          Appears normal
 Heart:            Appears normal         Spine:            Limited views
                   (4CH, axis, and                          previously seen
                   situs)
 RVOT:             Previously seen        Lower             Previously seen
                                          Extremities:
 LVOT:             Previously seen        Upper             Previously seen
                                          Extremities:

 Other:  Heels previously seen.

Fetal Evaluation (Fetus B)

 Num Of Fetuses:    2
 Fetal Heart Rate:  145                          bpm
 Cardiac Activity:  Observed
 Fetal Lie:         Upper Fetus
 Presentation:      Transverse, head to
                    maternal right
 Placenta:          Fundal, above cervical os
 P. Cord            Previously Visualized
 Insertion:

 Membrane Desc:     Dividing
                    Membrane seen
                    - Dichorionic.

 Amniotic Fluid
 AFI FV:      Oligohydramnios
                                             Larg Pckt:     2.9  cm
Biometry (Fetus B)

 BPD:     68.2  mm     G. Age:  27w 3d                CI:         74.5   70 - 86
 OFD:     91.5  mm                                    FL/HC:      20.8   18.8 -

 HC:     256.2  mm     G. Age:  27w 6d       17  %    HC/AC:      1.02   1.05 -

 AC:     251.3  mm     G. Age:  29w 2d       80  %    FL/BPD:     78.0   71 - 87
 FL:      53.2  mm     G. Age:  28w 2d       43  %    FL/AC:      21.2   20 - 24
 HUM:     45.2  mm     G. Age:  26w 5d       19  %
 Est. FW:    2780  gm    2 lb 13 oz      65  %     FW Discordancy      0 \ 6 %
Gestational Age (Fetus B)

 LMP:           28w 0d        Date:  12/22/13                 EDD:   09/28/14
 U/S Today:     28w 2d                                        EDD:   09/26/14
 Best:          28w 0d     Det. By:  LMP  (12/22/13)          EDD:   09/28/14
Anatomy (Fetus B)

 Cranium:          Appears normal         Diaphragm:        Not well visualized
 Fetal Cavum:      Appears normal         Stomach:          Present but small
 Ventricles:       Appears normal         Abdomen:          Echogenic Bowel
 Choroid Plexus:   Previously seen        Abdominal Wall:   Previously seen
 Cerebellum:       Previously seen        Cord Vessels:     2 vessel cord,
                                                            absent Lashalasha Realoba
 Posterior Fossa:  Previously seen        Kidneys:          Abnormal, see
                                                            comments
 Nuchal Fold:      Previously seen        Bladder:          Previously seen
 Face:             Orbits and profile     Spine:            Not well visualized
                   previously seen
 Lips:             Not well visualized    Lower             Abnormal, see
                                          Extremities:
                                                            comments
 Heart:            Abnml 4 chamber,       Upper             Previously seen
                   see comments
                                          Extremities:

 Other:  Technically difficult due to low amniotic fluid and fetal position.
Cervix Uterus Adnexa

 Cervical Length:    3        cm

 Cervix:       Normal appearance by transabdominal scan. Appears
               closed, without funnelling.
Impression

 Dichorionic/diamniotic twin pregnancy at 28w 5dweeks

 Twin A:
 Normal interval anatomy; anatomic survey complete except
 for arches
 Normal amniotic fluid volume
 Appropriate interval growth with EFW at the 56th %tile

 Twin B:
 Multiple anomalies mostly involving the GU tract and chest as
 previously described - likely obstructive renal dysplasia
 Abnormal left lower extremity with a single long bone and a
 malposition foot
 Echo - detroposition of the heart, otherwise normal (possibly
 secondary to pulmonary hypoplasia)
 Oligohydramnios
 Appropriate interval growth with EFW at the 65th % tile
Recommendations

 Recommend follow-up ultrasound examination in 4 weeks for
 interval growth

 The patient inquired about whether or not she should delivery
 at [HOSPITAL].  Given oligohydramnios and small
 chest, feel that the prognosis is guarded regardless of
 whether the patient delivers here or at another tertiary care
 center.  Will discuss with Neonatology at the next [facility]
 meeting.

## 2016-10-18 ENCOUNTER — Ambulatory Visit: Payer: Medicaid Other | Admitting: Obstetrics & Gynecology

## 2018-06-24 ENCOUNTER — Other Ambulatory Visit: Payer: Self-pay

## 2018-06-24 ENCOUNTER — Emergency Department
Admission: EM | Admit: 2018-06-24 | Discharge: 2018-06-24 | Disposition: A | Discharge status: Home / Self Care | Payer: BLUE CROSS/BLUE SHIELD | Attending: Emergency Medicine | Admitting: Emergency Medicine

## 2018-06-24 ENCOUNTER — Encounter: Payer: Self-pay | Admitting: Emergency Medicine

## 2018-06-24 DIAGNOSIS — R070 Pain in throat: Secondary | ICD-10-CM | POA: Diagnosis not present

## 2018-06-24 DIAGNOSIS — R509 Fever, unspecified: Secondary | ICD-10-CM | POA: Diagnosis present

## 2018-06-24 DIAGNOSIS — B9789 Other viral agents as the cause of diseases classified elsewhere: Secondary | ICD-10-CM | POA: Insufficient documentation

## 2018-06-24 DIAGNOSIS — Z8632 Personal history of gestational diabetes: Secondary | ICD-10-CM | POA: Diagnosis not present

## 2018-06-24 DIAGNOSIS — J069 Acute upper respiratory infection, unspecified: Secondary | ICD-10-CM | POA: Diagnosis not present

## 2018-06-24 DIAGNOSIS — R05 Cough: Secondary | ICD-10-CM | POA: Insufficient documentation

## 2018-06-24 DIAGNOSIS — R0981 Nasal congestion: Secondary | ICD-10-CM | POA: Insufficient documentation

## 2018-06-24 MED ORDER — BENZONATATE 100 MG PO CAPS
200.0000 mg | ORAL_CAPSULE | Freq: Once | ORAL | Status: AC
  Administered 2018-06-24: 200 mg via ORAL
  Filled 2018-06-24: qty 2

## 2018-06-24 MED ORDER — HYDROCOD POLST-CPM POLST ER 10-8 MG/5ML PO SUER: 5.0000 mL | Freq: Two times a day (BID) | ORAL | 0 refills | Status: DC

## 2018-06-24 MED ORDER — IBUPROFEN 600 MG PO TABS: 600.0000 mg | ORAL_TABLET | Freq: Three times a day (TID) | ORAL | 0 refills | Status: DC | PRN

## 2018-06-24 MED ORDER — BENZONATATE 100 MG PO CAPS: 200.0000 mg | ORAL_CAPSULE | Freq: Three times a day (TID) | ORAL | 0 refills | Status: AC | PRN

## 2018-06-24 NOTE — ED Provider Notes (Signed)
[facility] Emergency Department Provider Note   ____________________________________________   First MD Initiated Contact with Patient 06/24/18 1222     (approximate)  I have reviewed the triage vital signs and the nursing notes.   HISTORY  Chief Complaint Fever; Cough; and Nasal Congestion    HPI Krystal Nguyen is a 28 y.o. female patient presents with 4 days of fever, nasal congestion, sore throat, and cough.  Patient denies nausea vomiting.  Patient had one episode of loose stools last night.  Patient rates her pain at 8/10.  Patient describes her pain is "ache".  No palliative measure for complaint.  Patient had taken a flu shot for this season.  Past Medical History:  Diagnosis Date  . Asthma    childhood  . Genital herpes   . Gestational diabetes   . Miscarriage   . Obesity     Patient Active Problem List   Diagnosis Date Noted  . S/P cesarean section 09/01/2014  . Dichorionic diamniotic twin pregnancy in third trimester   . Oligohydramnios for Twin B, antepartum   . Gestational diabetes mellitus, antepartum 07/11/2014  . Dichorionic diamniotic twin pregnancy in second trimester   . 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 Fetal renal anomaly for Twin B     Past Surgical History:  Procedure Laterality Date  . CESAREAN SECTION N/A 08/31/2014   Procedure: CESAREAN SECTION;  Surgeon: Lazaro Arms, MD;  Location: [facility];  Service: Obstetrics;  Laterality: N/A;  . NO PAST SURGERIES      Prior to Admission medications   Medication Sig Start Date End Date Taking? Authorizing Provider  benzonatate (TESSALON PERLES) 100 MG capsule Take 2 capsules (200 mg total) by mouth 3 (three) times daily as needed. 06/24/18 06/24/19  Joni Reining, PA-C  chlorpheniramine-HYDROcodone (TUSSIONEX PENNKINETIC ER) 10-8 MG/5ML SUER Take 5 mLs by mouth 2 (two) times daily. 06/24/18   Joni Reining, PA-C  ibuprofen  (ADVIL,MOTRIN) 200 MG tablet Take 800 mg by mouth every 6 (six) hours as needed for moderate pain.    [provider]  ibuprofen (ADVIL,MOTRIN) 600 MG tablet Take 1 tablet (600 mg total) by mouth every 8 (eight) hours as needed. 06/24/18   Joni Reining, PA-C    Allergies Bactrim [sulfamethoxazole-trimethoprim]  Family History  Problem Relation Age of Onset  . Diabetes Father   . Diabetes Maternal Grandmother   . Hypertension Maternal Grandmother   . Cancer Maternal Grandfather     Social History Social History   Tobacco Use  . Smoking status: Never Smoker  . Smokeless tobacco: Never Used  Substance Use Topics  . Alcohol use: No    Comment: social  . Drug use: No    Review of Systems Constitutional: No fever/chills Eyes: No visual changes. ENT: Nasal congestion and sore throat. Cardiovascular: Denies chest pain. Respiratory: Denies shortness of breath.  Nonproductive cough. Gastrointestinal: No abdominal pain.  No nausea, no vomiting.  No diarrhea.  No constipation. Genitourinary: Negative for dysuria. Musculoskeletal: Negative for back pain. Skin: Negative for rash. Neurological: Negative for headaches, focal weakness or numbness. Allergic/Immunilogical: Bactrim DS. ____________________________________________   PHYSICAL EXAM:  VITAL SIGNS: ED Triage Vitals  Enc Vitals Group     BP 06/24/18 1227 117/86     Pulse Rate 06/24/18 1227 84     Resp 06/24/18 1227 18     Temp 06/24/18 1227 98.2 F (36.8 C)     Temp  Source 06/24/18 1227 Oral     SpO2 06/24/18 1227 98 %     Weight 06/24/18 1225 254 lb (115.2 kg)     Height 06/24/18 1225 5\' 6"  (1.676 m)     Head Circumference --      Peak Flow --      Pain Score 06/24/18 1225 8     Pain Loc --      Pain Edu? --      Excl. in GC? --    Constitutional: Alert and oriented. Well appearing and in no acute distress. Eyes: Conjunctivae are normal. PERRL. EOMI. Head: Atraumatic. Nose: Edematous nasal  turbinates.  Mouth/Throat: Mucous membranes are moist.  Oropharynx non-erythematous.  Postnasal drainage. Neck: No stridor.   Hematological/Lymphatic/Immunilogical: No cervical lymphadenopathy. Cardiovascular: Normal rate, regular rhythm. Grossly normal heart sounds.  Good peripheral circulation. Respiratory: Normal respiratory effort.  No retractions. Lungs CTAB. Neurologic:  Normal speech and language. No gross focal neurologic deficits are appreciated. No gait instability. Skin:  Skin is warm, dry and intact. No rash noted. Psychiatric: Mood and affect are normal. Speech and behavior are normal.  ____________________________________________   LABS (all labs ordered are listed, but only abnormal results are displayed)  Labs Reviewed - No data to display ____________________________________________  EKG   ____________________________________________  RADIOLOGY  ED MD interpretation:    Official radiology report(s): No results found.  ____________________________________________   PROCEDURES  Procedure(s) performed: None  Procedures  Critical Care performed: No  ____________________________________________   INITIAL IMPRESSION / ASSESSMENT AND PLAN / ED COURSE  As part of my medical decision making, I reviewed the following data within the electronic MEDICAL RECORD NUMBER    Patient presents with 4 days of nasal congestion, sore throat, cough.  Physical exam consistent with viral respiratory infection with cough.  Patient given discharge care instruction advised take medication as directed.  [facility].      ____________________________________________   FINAL CLINICAL IMPRESSION(S) / ED DIAGNOSES  Final diagnoses:  Viral URI with cough     ED Discharge Orders         Ordered    ibuprofen (ADVIL,MOTRIN) 600 MG tablet  Every 8 hours PRN     06/24/18 1311    benzonatate (TESSALON PERLES) 100 MG  capsule  3 times daily PRN     06/24/18 1311    chlorpheniramine-HYDROcodone (TUSSIONEX PENNKINETIC ER) 10-8 MG/5ML SUER  2 times daily     06/24/18 1311           Note:  This document was prepared using Dragon voice recognition software and may include unintentional dictation errors.    Joni ReiningSmith, Malakye Nolden K, PA-C 06/24/18 1316    Jeanmarie PlantMcShane, James A, MD 06/24/18 [phone]

## 2018-06-24 NOTE — ED Notes (Signed)
See triage note  States she developed cough and fever several days ago  States fever broke and is currently afebrile  conts to have cough and having some discomfort in chest d/t cough

## 2018-06-24 NOTE — ED Triage Notes (Signed)
Pt to ed with c/o fever, congestion and cough x several days. Last dose tylenol was on Sunday.

## 2018-09-06 ENCOUNTER — Emergency Department (HOSPITAL_COMMUNITY)
Admission: EM | Admit: 2018-09-06 | Discharge: 2018-09-06 | Disposition: A | Discharge status: Home / Self Care | Payer: BLUE CROSS/BLUE SHIELD | Attending: Emergency Medicine | Admitting: Emergency Medicine

## 2018-09-06 ENCOUNTER — Encounter (HOSPITAL_COMMUNITY): Payer: Self-pay

## 2018-09-06 ENCOUNTER — Emergency Department (HOSPITAL_COMMUNITY): Payer: BLUE CROSS/BLUE SHIELD

## 2018-09-06 DIAGNOSIS — M722 Plantar fascial fibromatosis: Secondary | ICD-10-CM | POA: Diagnosis not present

## 2018-09-06 DIAGNOSIS — M79672 Pain in left foot: Secondary | ICD-10-CM | POA: Diagnosis present

## 2018-09-06 DIAGNOSIS — J45909 Unspecified asthma, uncomplicated: Secondary | ICD-10-CM | POA: Diagnosis not present

## 2018-09-06 LAB — BASIC METABOLIC PANEL
Anion gap: 5 (ref 5–15)
BUN: 5 mg/dL — AB (ref 6–20)
CO2: 29 mmol/L (ref 22–32)
Calcium: 9.4 mg/dL (ref 8.9–10.3)
Chloride: 102 mmol/L (ref 98–111)
Creatinine, Ser: 0.69 mg/dL (ref 0.44–1.00)
GFR calc Af Amer: >60 mL/min (ref >60)
Glucose, Bld: 91 mg/dL (ref 70–99)
POTASSIUM: 3.7 mmol/L (ref 3.5–5.1)
SODIUM: 136 mmol/L (ref 135–145)

## 2018-09-06 LAB — CBC WITH DIFFERENTIAL/PLATELET
ABS IMMATURE GRANULOCYTES: 0.01 10*3/uL (ref 0.00–0.07)
BASOS ABS: 0 10*3/uL (ref 0.0–0.1)
Basophils Relative: 1 %
Eosinophils Absolute: 0.1 10*3/uL (ref 0.0–0.5)
Eosinophils Relative: 2 %
HCT: 38.1 % (ref 36.0–46.0)
Hemoglobin: 12.4 g/dL (ref 12.0–15.0)
Immature Granulocytes: 0 %
Lymphocytes Relative: 44 %
Lymphs Abs: 3 10*3/uL (ref 0.7–4.0)
MCH: 30.5 pg (ref 26.0–34.0)
MCHC: 32.5 g/dL (ref 30.0–36.0)
MCV: 93.6 fL (ref 80.0–100.0)
MONO ABS: 0.5 10*3/uL (ref 0.1–1.0)
Monocytes Relative: 8 %
NEUTROS ABS: 3 10*3/uL (ref 1.7–7.7)
NRBC: 0 % (ref 0.0–0.2)
Neutrophils Relative %: 45 %
PLATELETS: 321 10*3/uL (ref 150–400)
RBC: 4.07 MIL/uL (ref 3.87–5.11)
RDW: 11.9 % (ref 11.5–15.5)
WBC: 6.7 10*3/uL (ref 4.0–10.5)

## 2018-09-06 LAB — URIC ACID: Uric Acid, Serum: 4.3 mg/dL (ref 2.5–7.1)

## 2018-09-06 MED ORDER — MELOXICAM 15 MG PO TABS: 15.0000 mg | ORAL_TABLET | Freq: Every day | ORAL | 0 refills | Status: AC

## 2018-09-06 NOTE — ED Triage Notes (Signed)
Pt presents for left ankle swelling and pain x2 months. Pt denies injury/trauma to extremity. Pt states she was seen at urgent care in December for same, xray was negative. Pt able to ambulate without difficulty. Pt has full ROM to left leg and ankle. Painful to the touch, no redness, skin warm and dry.

## 2018-09-06 NOTE — ED Provider Notes (Signed)
[facility] EMERGENCY DEPARTMENT Provider Note   CSN: [id number] Arrival date & time: 09/06/18  1603    History   Chief Complaint Chief Complaint  Patient presents with  . Ankle Pain    HPI Analeigha Loeffler is a 28 y.o. female.     The history is provided by the patient. No language interpreter was used.  Foot Pain  This is a new problem. Episode onset: 2 months. The problem occurs every several days. The problem has been gradually worsening. Nothing aggravates the symptoms. Nothing relieves the symptoms. She has tried nothing for the symptoms. The treatment provided no relief.    Past Medical History:  Diagnosis Date  . Asthma    childhood  . Genital herpes   . Gestational diabetes   . Miscarriage   . Obesity     Patient Active Problem List   Diagnosis Date Noted  . S/P cesarean section 09/01/2014  . Dichorionic diamniotic twin pregnancy in third trimester   . Oligohydramnios for Twin B, antepartum   . Gestational diabetes mellitus, antepartum 07/11/2014  . Dichorionic diamniotic twin pregnancy in second trimester   . 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 Fetal renal anomaly for Twin B     Past Surgical History:  Procedure Laterality Date  . CESAREAN SECTION N/A 08/31/2014   Procedure: CESAREAN SECTION;  Surgeon: Lazaro Arms, MD;  Location: [facility];  Service: Obstetrics;  Laterality: N/A;  . NO PAST SURGERIES       OB History    Gravida  2   Para  1   Term      Preterm  1   AB  1   Living  1     SAB  1   TAB      Ectopic      Multiple  1   Live Births  2            Home Medications    Prior to Admission medications   Medication Sig Start Date End Date Taking? Authorizing Provider  benzonatate (TESSALON PERLES) 100 MG capsule Take 2 capsules (200 mg total) by mouth 3 (three) times daily as needed. 06/24/18 06/24/19  Joni Reining, PA-C  chlorpheniramine-HYDROcodone (TUSSIONEX  PENNKINETIC ER) 10-8 MG/5ML SUER Take 5 mLs by mouth 2 (two) times daily. 06/24/18   Joni Reining, PA-C  ibuprofen (ADVIL,MOTRIN) 200 MG tablet Take 800 mg by mouth every 6 (six) hours as needed for moderate pain.    [provider]  ibuprofen (ADVIL,MOTRIN) 600 MG tablet Take 1 tablet (600 mg total) by mouth every 8 (eight) hours as needed. 06/24/18   Joni Reining, PA-C    Family History Family History  Problem Relation Age of Onset  . Diabetes Father   . Diabetes Maternal Grandmother   . Hypertension Maternal Grandmother   . Cancer Maternal Grandfather     Social History Social History   Tobacco Use  . Smoking status: Never Smoker  . Smokeless tobacco: Never Used  Substance Use Topics  . Alcohol use: No    Comment: social  . Drug use: No     Allergies   Bactrim [sulfamethoxazole-trimethoprim] and Sulfamethoxazole-trimethoprim   Review of Systems Review of Systems  Musculoskeletal: Positive for joint swelling and myalgias.  All other systems reviewed and are negative.    Physical Exam Updated Vital Signs BP (!) 125/91   Pulse 83   Temp 98.9 F (  37.2 C) (Oral)   Resp 14   SpO2 100%   Physical Exam Vitals signs reviewed.  Cardiovascular:     Rate and Rhythm: Normal rate.  Pulmonary:     Effort: Pulmonary effort is normal.  Musculoskeletal:        General: Swelling and tenderness present.     Comments: Tender left mid foot, pain with movement,  Tender bottom of foot,  nv and ns intact   Skin:    General: Skin is warm.  Neurological:     General: No focal deficit present.     Mental Status: She is alert.  Psychiatric:        Mood and Affect: Mood normal.      ED Treatments / Results  Labs (all labs ordered are listed, but only abnormal results are displayed) Labs Reviewed  CBC WITH DIFFERENTIAL/PLATELET  BASIC METABOLIC PANEL  URIC ACID    EKG None  Radiology Dg Foot Complete Left  Result Date: 09/06/2018 CLINICAL DATA:  Left  ankle swelling and pain for 2 months. No trauma or injury. EXAM: LEFT FOOT - COMPLETE 3+ VIEW COMPARISON:  None. FINDINGS: There is no evidence of fracture or dislocation. There is no evidence of arthropathy or other focal bone abnormality. Soft tissues are unremarkable. IMPRESSION: Negative. Electronically Signed   By: Gerome Sam III M.D   On: 09/06/2018 18:17    Procedures Procedures (including critical care time)  Medications Ordered in ED Medications - No data to display   Initial Impression / Assessment and Plan / ED Course  I have reviewed the triage vital signs and the nursing notes.  Pertinent labs & imaging results that were available during my care of the patient were reviewed by me and considered in my medical decision making (see chart for details).          Final Clinical Impressions(s) / ED Diagnoses   Final diagnoses:  Plantar fasciitis, left    ED Discharge Orders         Ordered    meloxicam (MOBIC) 15 MG tablet  Daily     09/06/18 1920        An After Visit Summary was printed and given to the patient.    Osie Cheeks 09/06/18 Margretta Ditty    Arby Barrette, MD 09/10/18 [id number]

## 2019-06-23 ENCOUNTER — Other Ambulatory Visit: Payer: Self-pay

## 2019-06-23 DIAGNOSIS — Z20822 Contact with and (suspected) exposure to covid-19: Secondary | ICD-10-CM

## 2019-06-24 LAB — NOVEL CORONAVIRUS, NAA: SARS-CoV-2, NAA: NOT DETECTED

## 2020-11-01 ENCOUNTER — Encounter: Payer: Self-pay | Admitting: Plastic Surgery

## 2020-11-01 ENCOUNTER — Ambulatory Visit (INDEPENDENT_AMBULATORY_CARE_PROVIDER_SITE_OTHER): Payer: BC Managed Care – PPO | Admitting: Plastic Surgery

## 2020-11-01 ENCOUNTER — Other Ambulatory Visit: Payer: Self-pay

## 2020-11-01 VITALS — BP 120/82 | HR 96 | Ht 66.0 in | Wt 269.0 lb

## 2020-11-01 DIAGNOSIS — M546 Pain in thoracic spine: Secondary | ICD-10-CM

## 2020-11-01 DIAGNOSIS — M545 Low back pain, unspecified: Secondary | ICD-10-CM

## 2020-11-01 DIAGNOSIS — N62 Hypertrophy of breast: Secondary | ICD-10-CM

## 2020-11-01 DIAGNOSIS — M4004 Postural kyphosis, thoracic region: Secondary | ICD-10-CM | POA: Diagnosis not present

## 2020-11-01 NOTE — Progress Notes (Signed)
Referring Provider Hyacinth Meeker, [place], [place] [address] [place],  [place] [address]   CC:  Chief Complaint  Patient presents with  . consult      Krystal Nguyen is an 30 y.o. female.  HPI: Patient presents to discuss breast reduction.  She said years of back pain, neck pain and shoulder grooving related to her large breasts.  She tried over-the-counter medications, warm packs, cold packs and supportive bras with little relief.  She also gets rashes beneath her breast that have been refractory to over-the-counter treatment.  She is currently an F cup and wants to be a B or C cup.  She has no previous breast biopsies or procedures herself but does have an aunt who had breast cancer.  She does not smoke and is not a diabetic.  Allergies  Allergen Reactions  . Bactrim [Sulfamethoxazole-Trimethoprim] Hives and Itching  . Sulfamethoxazole-Trimethoprim Rash    Outpatient Encounter Medications as of 11/01/2020  Medication Sig  . chlorpheniramine-HYDROcodone (TUSSIONEX PENNKINETIC ER) 10-8 MG/5ML SUER Take 5 mLs by mouth 2 (two) times daily.  Marland Kitchen ibuprofen (ADVIL,MOTRIN) 200 MG tablet Take 800 mg by mouth every 6 (six) hours as needed for moderate pain.  Marland Kitchen ibuprofen (ADVIL,MOTRIN) 600 MG tablet Take 1 tablet (600 mg total) by mouth every 8 (eight) hours as needed.   No facility-administered encounter medications on file as of 11/01/2020.     Past Medical History:  Diagnosis Date  . Asthma    childhood  . Genital herpes   . Gestational diabetes   . Miscarriage   . Obesity     Past Surgical History:  Procedure Laterality Date  . CESAREAN SECTION N/A 08/31/2014   Procedure: CESAREAN SECTION;  Surgeon: Lazaro Arms, MD;  Location: [facility];  Service: Obstetrics;  Laterality: N/A;  . NO PAST SURGERIES      Family History  Problem Relation Age of Onset  . Diabetes Father   . Diabetes Maternal Grandmother   . Hypertension Maternal Grandmother   . Cancer Maternal  Grandfather     Social History   Social History Narrative  . Not on file     Review of Systems General: Denies fevers, chills, weight loss CV: Denies chest pain, shortness of breath, palpitations  Physical Exam Vitals with BMI 11/01/2020 09/06/2018 09/06/2018  Height 5\' 6"  - -  Weight 269 lbs - -  BMI 43.44 - -  Systolic 120 123  Diastolic 82 81 91  Pulse 96 76 83    General:  No acute distress,  Alert and oriented, Non-Toxic, Normal speech and affect Breast: She has grade 3 ptosis.  Sternal notch to nipple is 40 cm bilaterally.  Nipple to fold is 19 cm bilaterally.  I do not see any obvious scars or masses  Assessment/Plan The patient has bilateral symptomatic macromastia.  She is a good candidate for a breast reduction.  She is interested in pursuing surgical treatment.  She has tried supportive garments and fitted bras with no relief.  The details of breast reduction surgery were discussed.  I explained the procedure in detail along the with the expected scars.  The risks were discussed in detail and include bleeding, infection, damage to surrounding structures, need for additional procedures, nipple loss, change in nipple sensation, persistent pain, contour irregularities and asymmetries.  I explained that breast feeding is often not possible after breast reduction surgery.  We discussed the expected postoperative course with an overall recovery period of  about 1 month.  She demonstrated full understanding of all risks.  We discussed her personal risk factors that include the length of her breast.  I discussed the fact that there would be a limit to how small I could make her with a pedicle reduction.  If she wanted to be a B cup I probably need to do a free nipple graft.  She is leaning towards just going a small as I could comfortably make her with a pedicle reduction but it is good to think about the options..  The patient is interested in pursuing surgical treatment.  I  anticipate approximately 1200g of tissue removed from each side.   [name] 11/01/2020, 5:32 PM

## 2020-12-19 ENCOUNTER — Ambulatory Visit: Payer: BC Managed Care – PPO | Admitting: Physical Therapy

## 2021-05-01 ENCOUNTER — Other Ambulatory Visit (HOSPITAL_COMMUNITY): Payer: Self-pay | Admitting: Internal Medicine

## 2021-05-01 ENCOUNTER — Other Ambulatory Visit: Payer: Self-pay | Admitting: Internal Medicine

## 2021-05-01 ENCOUNTER — Ambulatory Visit
Admission: RE | Admit: 2021-05-01 | Discharge: 2021-05-01 | Disposition: A | Discharge status: Home / Self Care | Payer: BC Managed Care – PPO | Source: Ambulatory Visit | Attending: Internal Medicine | Admitting: Internal Medicine

## 2021-05-01 DIAGNOSIS — M79672 Pain in left foot: Secondary | ICD-10-CM

## 2021-05-01 DIAGNOSIS — R002 Palpitations: Secondary | ICD-10-CM

## 2021-05-11 ENCOUNTER — Telehealth (HOSPITAL_COMMUNITY): Payer: Self-pay | Admitting: *Deleted

## 2021-05-11 NOTE — Telephone Encounter (Signed)
Close encounter 

## 2021-05-15 ENCOUNTER — Ambulatory Visit (HOSPITAL_COMMUNITY)
Admission: RE | Admit: 2021-05-15 | Discharge: 2021-05-15 | Disposition: A | Discharge status: Home / Self Care | Payer: BC Managed Care – PPO | Source: Ambulatory Visit | Attending: Internal Medicine | Admitting: Internal Medicine

## 2021-05-15 ENCOUNTER — Other Ambulatory Visit: Payer: Self-pay

## 2021-05-15 DIAGNOSIS — R002 Palpitations: Secondary | ICD-10-CM | POA: Insufficient documentation

## 2021-05-15 LAB — EXERCISE TOLERANCE TEST
Angina Index: 0
Duke Treadmill Score: 7
Estimated workload: 9.1
Exercise duration (min): 7 min
Exercise duration (sec): 15 s
MPHR: 190 {beats}/min
Peak HR: 171 {beats}/min
Percent HR: 90 %
Rest HR: 93 {beats}/min
ST Depression (mm): 0 mm

## 2021-06-27 ENCOUNTER — Other Ambulatory Visit: Payer: Self-pay | Admitting: Internal Medicine

## 2021-06-27 DIAGNOSIS — G4452 New daily persistent headache (NDPH): Secondary | ICD-10-CM

## 2021-07-11 ENCOUNTER — Other Ambulatory Visit: Payer: BC Managed Care – PPO

## 2021-10-24 ENCOUNTER — Ambulatory Visit (INDEPENDENT_AMBULATORY_CARE_PROVIDER_SITE_OTHER): Payer: BC Managed Care – PPO | Admitting: Allergy

## 2021-10-24 ENCOUNTER — Encounter: Payer: Self-pay | Admitting: Allergy

## 2021-10-24 VITALS — BP 126/80 | HR 89 | Temp 98.2°F | Resp 16 | Ht 66.0 in | Wt 263.0 lb

## 2021-10-24 DIAGNOSIS — T783XXD Angioneurotic edema, subsequent encounter: Secondary | ICD-10-CM

## 2021-10-24 DIAGNOSIS — L508 Other urticaria: Secondary | ICD-10-CM | POA: Diagnosis not present

## 2021-10-24 NOTE — Patient Instructions (Addendum)
Chronic hives and swelling ?- at this time etiology of hives and swelling is unknown.  Hives can be caused by a variety of different triggers including illness/infection, foods, medications, stings, exercise, pressure, vibrations, extremes of temperature to name a few however majority of the time there is no identifiable trigger.  Your symptoms have been ongoing for >6 weeks making this chronic thus will obtain labwork to evaluate: CBC w diff, CMP, tryptase, hive panel, environmental panel, alpha-gal panel, inflammatory markers ?- for hive control recommend high-dose antihistamine: Allegra 180mg  1 tab twice a day with Pepcid 20mg  1 tab twice a day ?- if above regimen is not effective enough in controlling hives let know and would add in Singulair at that time.  If triple therapy regimen is not effective enough in controlling hives then would recommend proceeding to Xolair monthly injections for chronic spontaneous hive control.  Will discuss this in more details in needed at future visit.   ?- do not believe your hives are due to the sumatriptan medication ?- some medications that are known to cause hives/swelling/itch as a side effect are NSAIDs (like ibuprofen, motrin, advil, aleve, aspirin etc) and opiate medications (like hydrocodone, vicodin etc).  Some foods can cause histamine release which can leave to hives/swelling/itch like large quantities of strawberries and alcoholic beverages too.    ?- avoid tight clothing if hives are pressure induced ? ?Follow-up in 2-3 months or sooner if needed ? ? ?

## 2021-10-24 NOTE — Progress Notes (Signed)
? ? ? ?New Patient Note ? ?RE: Krystal Nguyen MRN: [id number] DOB: [birth year] ?Date of Office Visit: 10/24/2021 ? ?Primary care provider: Collene Mares, [place] ? ?Chief Complaint: hives ? ?History of present illness: ?Krystal Nguyen is a 31 y.o. female presenting today for evaluation of urticaria.  ?She started having hives 08/22/21.  She states initially she had them daily for about 2-3 weeks.  The hives have returned since the end of April mostly on her arms and chest.  She states the hies when present are red, swollen, itchy.  Currently states the hives can be painful when swollen.  She initially thought the hives were triggered by twizzlers candies as she was eating then initially but she has had twizzlers since without hive development.  Hives did leave bruising marks if she scratched the skin otherwise did not leave any bruising.   ?Reports lip swelling with this as well.  No fevers. She does report leg pain with the hives. No preceding illness.  No concern for food triggers.  No concern for bites or stings.  No change in body products/detergents.  She states she was using Advil often due to headaches but does not report having headaches with the hives.   ?Hives not induced by pressure. Hot showers would flare up the hives.  ?She did use calamine lotion that temporarily worked.  She used shea butter and states it kept her skin moisturized.  She also used Cerava which she felt was helpful.  ?She states she did take sumatriptan for headache management and started this prior to March but she did stop this medication.  She has continued to have hives however. ?She has received a steroid injection (Depo-Medrol) from her PCP a back in March for hives.  She has received prednisone pack from her PCP for hive treatment.  She has also been prescribed hydroxyzine as needed use.  She states these medications did not seem to that helpful for her however.   ? ?Review of systems: ?Review of Systems  ?Constitutional:  Negative.   ?HENT: Negative.    ?Eyes: Negative.   ?Respiratory: Negative.    ?Cardiovascular: Negative.   ?Gastrointestinal: Negative.   ?Musculoskeletal: Negative.   ?Skin:  Positive for rash.  ?Allergic/Immunologic: Negative.   ?Neurological: Negative.   ? ?All other systems negative unless noted above in HPI ? ?Past medical history: ?Past Medical History:  ?Diagnosis Date  ? Asthma   ? childhood  ? Eczema   ? Genital herpes   ? Gestational diabetes   ? Miscarriage   ? Obesity   ? ? ?Past surgical history: ?Past Surgical History:  ?Procedure Laterality Date  ? CESAREAN SECTION N/A 08/31/2014  ? Procedure: CESAREAN SECTION;  Surgeon: Lazaro Arms, MD;  Location: [facility];  Service: Obstetrics;  Laterality: N/A;  ? TYMPANOSTOMY TUBE PLACEMENT    ? ? ?Family history:  ?Family History  ?Problem Relation Age of Onset  ? Diabetes Father   ? Diabetes Maternal Grandmother   ? Hypertension Maternal Grandmother   ? Cancer Maternal Grandfather   ? ? ?Social history: ?Lives in an apartment with carpeting with electric heating and central cooling.  No pets in the home.  There are dogs and cats outside the home.  There is no concern for water damage, mildew or roaches in the home.  She is a Runner, broadcasting/film/video.  She denies a smoking history. ? ? ?Medication List: ?Current Outpatient Medications  ?Medication Sig Dispense Refill  ? Ferrous Sulfate (IRON) 325 (65  Fe) MG TABS 1 tablet    ? ibuprofen (ADVIL) 800 MG tablet Take 800 mg by mouth every 8 (eight) hours as needed.    ? ?No current facility-administered medications for this visit.  ? ? ?Known medication allergies: ?Allergies  ?Allergen Reactions  ? Bactrim [Sulfamethoxazole-Trimethoprim] Hives and Itching  ? Sulfamethoxazole-Trimethoprim Rash  ? ? ? ?Physical examination: ?Blood pressure 126/80, pulse 89, temperature 98.2 ?F (36.8 ?C), resp. rate 16, height 5\' 6"  (1.676 m), weight 263 lb (119.3 kg), SpO2 98 %, currently breastfeeding. ? ?General: Alert, interactive, in no acute  distress. ?HEENT: PERRLA, TMs pearly gray, turbinates minimally edematous without discharge, post-pharynx mildly erythematous. ?Neck: Supple without lymphadenopathy. ?Lungs: Clear to auscultation without wheezing, rhonchi or rales. {no increased work of breathing. ?CV: Normal S1, S2 without murmurs. ?Abdomen: Nondistended, nontender. ?Skin: Scattered erythematous urticarial type lesions primarily located right forearm , nonvesicular. ?Extremities:  No clubbing, cyanosis or edema. ?Neuro:   Grossly intact. ? ?Diagnositics/Labs: ?None today due to active urticaria ? ?Assessment and plan: ?  ?Chronic urticaria and angioedema ?- at this time etiology of hives and swelling is unknown.  Hives can be caused by a variety of different triggers including illness/infection, foods, medications, stings, exercise, pressure, vibrations, extremes of temperature to name a few however majority of the time there is no identifiable trigger.  Your symptoms have been ongoing for >6 weeks making this chronic thus will obtain labwork to evaluate: CBC w diff, CMP, tryptase, hive panel, environmental panel, alpha-gal panel, inflammatory markers ?- for hive control recommend high-dose antihistamine: Allegra 180mg  1 tab twice a day with Pepcid 20mg  1 tab twice a day ?- if above regimen is not effective enough in controlling hives let know and would add in Singulair at that time.  If triple therapy regimen is not effective enough in controlling hives then would recommend proceeding to Xolair monthly injections for chronic spontaneous hive control.  Will discuss this in more details in needed at future visit.   ?- do not believe your hives are due to the sumatriptan medication ?- some medications that are known to cause hives/swelling/itch as a side effect are NSAIDs (like ibuprofen, motrin, advil, aleve, aspirin etc) and opiate medications (like hydrocodone, vicodin etc).  Some foods can cause histamine release which can leave to  hives/swelling/itch like large quantities of strawberries and alcoholic beverages too.    ?- avoid tight clothing if hives are pressure induced ? ?Follow-up in 2-3 months or sooner if needed ? ?I appreciate the opportunity to take part in Krystal Nguyen's care. Please do not hesitate to contact me with questions. ? ?Sincerely, ? ? ? , MD ?Allergy/Immunology ?[facility] ?

## 2021-11-02 LAB — ALLERGENS W/TOTAL IGE AREA 2

## 2021-11-02 LAB — COMPREHENSIVE METABOLIC PANEL
ALT: 5 IU/L (ref 0–32)
AST: 12 IU/L (ref 0–40)
Albumin/Globulin Ratio: 1.4 (ref 1.2–2.2)
Albumin: 4.2 g/dL (ref 3.8–4.8)
Alkaline Phosphatase: 67 IU/L (ref 44–121)
BUN/Creatinine Ratio: 15 (ref 9–23)
BUN: 12 mg/dL (ref 6–20)
Bilirubin Total: 0.4 mg/dL (ref 0.0–1.2)
CO2: 24 mmol/L (ref 20–29)
Calcium: 9 mg/dL (ref 8.7–10.2)
Chloride: 102 mmol/L (ref 96–106)
Creatinine, Ser: 0.82 mg/dL (ref 0.57–1.00)
Globulin, Total: 3.1 g/dL (ref 1.5–4.5)
Glucose: 113 mg/dL — ABNORMAL HIGH (ref 70–99)
Potassium: 4.4 mmol/L (ref 3.5–5.2)
Sodium: 137 mmol/L (ref 134–144)
Total Protein: 7.3 g/dL (ref 6.0–8.5)
eGFR: 98 mL/min/{1.73_m2} (ref >59)

## 2021-11-02 LAB — CBC WITH DIFFERENTIAL
Basophils Absolute: 0 10*3/uL (ref 0.0–0.2)
Basos: 1 %
EOS (ABSOLUTE): 0.1 10*3/uL (ref 0.0–0.4)
Eos: 1 %
Hematocrit: 34.8 % (ref 34.0–46.6)
Hemoglobin: 11.8 g/dL (ref 11.1–15.9)
Immature Grans (Abs): 0 10*3/uL (ref 0.0–0.1)
Immature Granulocytes: 0 %
Lymphocytes Absolute: 1.8 10*3/uL (ref 0.7–3.1)
Lymphs: 43 %
MCH: 31.8 pg (ref 26.6–33.0)
MCHC: 33.9 g/dL (ref 31.5–35.7)
MCV: 94 fL (ref 79–97)
Monocytes Absolute: 0.4 10*3/uL (ref 0.1–0.9)
Monocytes: 8 %
Neutrophils Absolute: 2 10*3/uL (ref 1.4–7.0)
Neutrophils: 47 %
RBC: 3.71 x10E6/uL — ABNORMAL LOW (ref 3.77–5.28)
RDW: 11.3 % — ABNORMAL LOW (ref 11.7–15.4)
WBC: 4.3 10*3/uL (ref 3.4–10.8)

## 2021-11-02 LAB — ALPHA-GAL PANEL
Allergen Lamb IgE: <0.1 kU/L
Beef IgE: <0.1 kU/L
IgE (Immunoglobulin E), Serum: 19 IU/mL (ref 6–495)
O215-IgE Alpha-Gal: <0.1 kU/L
Pork IgE: <0.1 kU/L

## 2021-11-02 LAB — THYROID ANTIBODIES
Thyroglobulin Antibody: 1.1 IU/mL — ABNORMAL HIGH (ref 0.0–0.9)
Thyroperoxidase Ab SerPl-aCnc: <9 IU/mL (ref 0–34)

## 2021-11-02 LAB — ANA W/REFLEX: Anti Nuclear Antibody (ANA): NEGATIVE

## 2021-11-02 LAB — TRYPTASE: Tryptase: 6.6 ug/L (ref 2.2–13.2)

## 2021-11-02 LAB — SEDIMENTATION RATE: Sed Rate: 28 mm/hr (ref 0–32)

## 2021-11-02 LAB — CHRONIC URTICARIA: cu index: <1 (ref <10)

## 2021-11-07 ENCOUNTER — Other Ambulatory Visit: Payer: Self-pay | Admitting: Internal Medicine

## 2021-11-07 ENCOUNTER — Ambulatory Visit
Admission: RE | Admit: 2021-11-07 | Discharge: 2021-11-07 | Disposition: A | Discharge status: Home / Self Care | Payer: BC Managed Care – PPO | Source: Ambulatory Visit | Attending: Internal Medicine | Admitting: Internal Medicine

## 2021-11-07 DIAGNOSIS — R109 Unspecified abdominal pain: Secondary | ICD-10-CM

## 2021-12-06 ENCOUNTER — Ambulatory Visit: Payer: BC Managed Care – PPO | Admitting: Allergy

## 2022-01-03 ENCOUNTER — Other Ambulatory Visit: Payer: Self-pay | Admitting: Internal Medicine

## 2022-01-03 ENCOUNTER — Ambulatory Visit
Admission: RE | Admit: 2022-01-03 | Discharge: 2022-01-03 | Disposition: A | Discharge status: Home / Self Care | Payer: BC Managed Care – PPO | Source: Ambulatory Visit | Attending: Internal Medicine | Admitting: Internal Medicine

## 2022-01-03 DIAGNOSIS — R0781 Pleurodynia: Secondary | ICD-10-CM

## 2022-03-01 ENCOUNTER — Ambulatory Visit: Payer: BC Managed Care – PPO | Admitting: Allergy
